# Patient Record
Sex: Female | Born: 1940 | Race: White | Hispanic: No | Smoking: Former smoker
Health system: Southern US, Community
[De-identification: ages and names within clinical notes are randomized; demographics above are authoritative.]

## PROBLEM LIST (undated history)

## (undated) DIAGNOSIS — D649 Anemia, unspecified: Secondary | ICD-10-CM

## (undated) DIAGNOSIS — J309 Allergic rhinitis, unspecified: Secondary | ICD-10-CM

## (undated) DIAGNOSIS — M810 Age-related osteoporosis without current pathological fracture: Secondary | ICD-10-CM

## (undated) DIAGNOSIS — E739 Lactose intolerance, unspecified: Secondary | ICD-10-CM

## (undated) DIAGNOSIS — R7309 Other abnormal glucose: Secondary | ICD-10-CM

## (undated) DIAGNOSIS — J45901 Unspecified asthma with (acute) exacerbation: Secondary | ICD-10-CM

## (undated) DIAGNOSIS — E039 Hypothyroidism, unspecified: Secondary | ICD-10-CM

## (undated) DIAGNOSIS — E785 Hyperlipidemia, unspecified: Secondary | ICD-10-CM

## (undated) DIAGNOSIS — I10 Essential (primary) hypertension: Secondary | ICD-10-CM

## (undated) DIAGNOSIS — R413 Other amnesia: Secondary | ICD-10-CM

## (undated) HISTORY — DX: Essential (primary) hypertension: I10

## (undated) HISTORY — DX: Allergic rhinitis, unspecified: J30.9

## (undated) HISTORY — DX: Lactose intolerance, unspecified: E73.9

## (undated) HISTORY — DX: Other amnesia: R41.3

## (undated) HISTORY — DX: Age-related osteoporosis without current pathological fracture: M81.0

## (undated) HISTORY — DX: Hypothyroidism, unspecified: E03.9

## (undated) HISTORY — DX: Unspecified asthma with (acute) exacerbation: J45.901

## (undated) HISTORY — DX: Anemia, unspecified: D64.9

## (undated) HISTORY — DX: Hyperlipidemia, unspecified: E78.5

## (undated) HISTORY — DX: Other abnormal glucose: R73.09

---

## 1974-02-04 HISTORY — PX: BREAST SURGERY: SHX581

## 1993-02-04 HISTORY — PX: EYE SURGERY: SHX253

## 1997-12-23 ENCOUNTER — Other Ambulatory Visit: Admission: RE | Admit: 1997-12-23 | Discharge: 1997-12-23 | Payer: Self-pay | Admitting: *Deleted

## 1998-03-10 ENCOUNTER — Other Ambulatory Visit: Admission: RE | Admit: 1998-03-10 | Discharge: 1998-03-10 | Payer: Self-pay | Admitting: *Deleted

## 1998-09-15 ENCOUNTER — Other Ambulatory Visit: Admission: RE | Admit: 1998-09-15 | Discharge: 1998-09-15 | Payer: Self-pay | Admitting: *Deleted

## 1998-09-15 ENCOUNTER — Encounter (INDEPENDENT_AMBULATORY_CARE_PROVIDER_SITE_OTHER): Payer: Self-pay

## 1998-11-13 ENCOUNTER — Encounter (INDEPENDENT_AMBULATORY_CARE_PROVIDER_SITE_OTHER): Payer: Self-pay | Admitting: *Deleted

## 1998-11-13 ENCOUNTER — Other Ambulatory Visit: Admission: RE | Admit: 1998-11-13 | Discharge: 1998-11-13 | Payer: Self-pay | Admitting: *Deleted

## 1999-08-02 ENCOUNTER — Encounter: Admission: RE | Admit: 1999-08-02 | Discharge: 1999-08-02 | Payer: Self-pay | Admitting: Internal Medicine

## 1999-08-02 ENCOUNTER — Encounter: Payer: Self-pay | Admitting: Internal Medicine

## 2000-08-04 ENCOUNTER — Encounter: Admission: RE | Admit: 2000-08-04 | Discharge: 2000-08-04 | Payer: Self-pay | Admitting: Internal Medicine

## 2000-08-04 ENCOUNTER — Encounter: Payer: Self-pay | Admitting: Internal Medicine

## 2001-08-05 ENCOUNTER — Encounter: Admission: RE | Admit: 2001-08-05 | Discharge: 2001-08-05 | Payer: Self-pay | Admitting: Internal Medicine

## 2001-08-05 ENCOUNTER — Encounter: Payer: Self-pay | Admitting: Internal Medicine

## 2002-04-05 ENCOUNTER — Other Ambulatory Visit: Admission: RE | Admit: 2002-04-05 | Discharge: 2002-04-05 | Payer: Self-pay | Admitting: *Deleted

## 2002-08-10 ENCOUNTER — Encounter: Admission: RE | Admit: 2002-08-10 | Discharge: 2002-08-10 | Payer: Self-pay | Admitting: Internal Medicine

## 2002-08-10 ENCOUNTER — Encounter: Payer: Self-pay | Admitting: Internal Medicine

## 2003-04-28 ENCOUNTER — Other Ambulatory Visit: Admission: RE | Admit: 2003-04-28 | Discharge: 2003-04-28 | Payer: Self-pay | Admitting: *Deleted

## 2003-08-11 ENCOUNTER — Encounter: Admission: RE | Admit: 2003-08-11 | Discharge: 2003-08-11 | Payer: Self-pay | Admitting: *Deleted

## 2003-09-22 ENCOUNTER — Encounter: Payer: Self-pay | Admitting: Endocrinology

## 2004-01-24 ENCOUNTER — Ambulatory Visit: Payer: Self-pay | Admitting: Endocrinology

## 2004-02-05 LAB — HM DEXA SCAN

## 2004-02-08 ENCOUNTER — Ambulatory Visit: Payer: Self-pay | Admitting: Endocrinology

## 2004-02-24 ENCOUNTER — Ambulatory Visit: Payer: Self-pay | Admitting: Endocrinology

## 2004-02-27 ENCOUNTER — Encounter: Admission: RE | Admit: 2004-02-27 | Discharge: 2004-05-27 | Payer: Self-pay | Admitting: Endocrinology

## 2004-08-14 ENCOUNTER — Encounter: Admission: RE | Admit: 2004-08-14 | Discharge: 2004-08-14 | Payer: Self-pay | Admitting: *Deleted

## 2004-09-20 ENCOUNTER — Ambulatory Visit: Payer: Self-pay | Admitting: Endocrinology

## 2004-11-01 ENCOUNTER — Ambulatory Visit: Payer: Self-pay | Admitting: Endocrinology

## 2004-11-08 ENCOUNTER — Ambulatory Visit: Payer: Self-pay | Admitting: Endocrinology

## 2004-11-09 ENCOUNTER — Ambulatory Visit: Payer: Self-pay | Admitting: Endocrinology

## 2004-12-14 ENCOUNTER — Ambulatory Visit: Payer: Self-pay | Admitting: Endocrinology

## 2005-01-01 ENCOUNTER — Ambulatory Visit: Payer: Self-pay | Admitting: Endocrinology

## 2005-02-05 ENCOUNTER — Ambulatory Visit: Payer: Self-pay | Admitting: Endocrinology

## 2005-02-27 ENCOUNTER — Ambulatory Visit: Payer: Self-pay | Admitting: Endocrinology

## 2005-09-24 ENCOUNTER — Encounter: Admission: RE | Admit: 2005-09-24 | Discharge: 2005-09-24 | Payer: Self-pay | Admitting: *Deleted

## 2005-10-01 ENCOUNTER — Encounter: Admission: RE | Admit: 2005-10-01 | Discharge: 2005-10-01 | Payer: Self-pay | Admitting: *Deleted

## 2005-11-06 ENCOUNTER — Ambulatory Visit: Payer: Self-pay | Admitting: Endocrinology

## 2005-11-13 ENCOUNTER — Ambulatory Visit: Payer: Self-pay | Admitting: Endocrinology

## 2005-11-13 HISTORY — PX: ELECTROCARDIOGRAM: SHX264

## 2005-11-25 ENCOUNTER — Ambulatory Visit: Payer: Self-pay | Admitting: Endocrinology

## 2005-12-31 ENCOUNTER — Ambulatory Visit: Payer: Self-pay | Admitting: Endocrinology

## 2005-12-31 LAB — CONVERTED CEMR LAB
AST: 29 units/L (ref 0–37)
Bilirubin, Direct: 0.1 mg/dL (ref 0.0–0.3)
Chol/HDL Ratio, serum: 4.1
Cholesterol: 232 mg/dL (ref 0–200)
LDL DIRECT: 158.2 mg/dL
Total Protein: 7.3 g/dL (ref 6.0–8.3)
Triglyceride fasting, serum: 117 mg/dL (ref 0–149)

## 2006-09-08 ENCOUNTER — Encounter: Payer: Self-pay | Admitting: Endocrinology

## 2006-09-08 DIAGNOSIS — M810 Age-related osteoporosis without current pathological fracture: Secondary | ICD-10-CM

## 2006-09-08 DIAGNOSIS — E039 Hypothyroidism, unspecified: Secondary | ICD-10-CM

## 2006-09-08 DIAGNOSIS — J309 Allergic rhinitis, unspecified: Secondary | ICD-10-CM | POA: Insufficient documentation

## 2006-09-08 DIAGNOSIS — I10 Essential (primary) hypertension: Secondary | ICD-10-CM | POA: Insufficient documentation

## 2006-09-08 HISTORY — DX: Essential (primary) hypertension: I10

## 2006-09-08 HISTORY — DX: Allergic rhinitis, unspecified: J30.9

## 2006-09-08 HISTORY — DX: Age-related osteoporosis without current pathological fracture: M81.0

## 2006-09-08 HISTORY — DX: Hypothyroidism, unspecified: E03.9

## 2006-09-30 ENCOUNTER — Encounter: Admission: RE | Admit: 2006-09-30 | Discharge: 2006-09-30 | Payer: Self-pay | Admitting: Endocrinology

## 2006-12-05 ENCOUNTER — Ambulatory Visit: Payer: Self-pay | Admitting: Endocrinology

## 2006-12-05 LAB — CONVERTED CEMR LAB
ALT: 31 units/L (ref 0–35)
AST: 26 units/L (ref 0–37)
Albumin: 4 g/dL (ref 3.5–5.2)
Alkaline Phosphatase: 77 units/L (ref 39–117)
Basophils Absolute: 0 10*3/uL (ref 0.0–0.1)
Bilirubin, Direct: 0.1 mg/dL (ref 0.0–0.3)
CO2: 31 meq/L (ref 19–32)
Cholesterol: 181 mg/dL (ref 0–200)
Eosinophils Relative: 2.4 % (ref 0.0–5.0)
GFR calc Af Amer: 107 mL/min
HDL: 43.5 mg/dL (ref >39.0)
Hemoglobin, Urine: NEGATIVE
LDL Cholesterol: 110 mg/dL — ABNORMAL HIGH (ref 0–99)
Leukocytes, UA: NEGATIVE
Lymphocytes Relative: 34.3 % (ref 12.0–46.0)
MCV: 94.2 fL (ref 78.0–100.0)
Monocytes Relative: 7.4 % (ref 3.0–11.0)
Platelets: 219 10*3/uL (ref 150–400)
RDW: 11.7 % (ref 11.5–14.6)
Sodium: 144 meq/L (ref 135–145)
TSH: 2.03 microintl units/mL (ref 0.35–5.50)
Total Bilirubin: 0.6 mg/dL (ref 0.3–1.2)
Total Protein, Urine: NEGATIVE mg/dL
Triglycerides: 140 mg/dL (ref 0–149)
Urine Glucose: NEGATIVE mg/dL
Urobilinogen, UA: 0.2 (ref 0.0–1.0)
WBC: 4.9 10*3/uL (ref 4.5–10.5)

## 2006-12-15 ENCOUNTER — Telehealth (INDEPENDENT_AMBULATORY_CARE_PROVIDER_SITE_OTHER): Payer: Self-pay | Admitting: *Deleted

## 2006-12-16 ENCOUNTER — Telehealth (INDEPENDENT_AMBULATORY_CARE_PROVIDER_SITE_OTHER): Payer: Self-pay | Admitting: *Deleted

## 2006-12-19 ENCOUNTER — Telehealth (INDEPENDENT_AMBULATORY_CARE_PROVIDER_SITE_OTHER): Payer: Self-pay | Admitting: *Deleted

## 2006-12-30 ENCOUNTER — Ambulatory Visit: Payer: Self-pay | Admitting: Endocrinology

## 2007-02-10 ENCOUNTER — Ambulatory Visit: Payer: Self-pay | Admitting: Endocrinology

## 2007-04-08 ENCOUNTER — Telehealth (INDEPENDENT_AMBULATORY_CARE_PROVIDER_SITE_OTHER): Payer: Self-pay | Admitting: *Deleted

## 2007-07-28 ENCOUNTER — Telehealth (INDEPENDENT_AMBULATORY_CARE_PROVIDER_SITE_OTHER): Payer: Self-pay | Admitting: *Deleted

## 2007-10-01 ENCOUNTER — Encounter: Admission: RE | Admit: 2007-10-01 | Discharge: 2007-10-01 | Payer: Self-pay | Admitting: Endocrinology

## 2007-10-07 ENCOUNTER — Encounter: Admission: RE | Admit: 2007-10-07 | Discharge: 2007-10-07 | Payer: Self-pay | Admitting: Endocrinology

## 2007-10-07 ENCOUNTER — Encounter: Payer: Self-pay | Admitting: Endocrinology

## 2007-11-12 ENCOUNTER — Telehealth (INDEPENDENT_AMBULATORY_CARE_PROVIDER_SITE_OTHER): Payer: Self-pay | Admitting: *Deleted

## 2007-11-20 ENCOUNTER — Telehealth (INDEPENDENT_AMBULATORY_CARE_PROVIDER_SITE_OTHER): Payer: Self-pay | Admitting: *Deleted

## 2007-11-20 ENCOUNTER — Ambulatory Visit: Payer: Self-pay | Admitting: Endocrinology

## 2008-01-25 ENCOUNTER — Telehealth (INDEPENDENT_AMBULATORY_CARE_PROVIDER_SITE_OTHER): Payer: Self-pay | Admitting: *Deleted

## 2008-02-03 ENCOUNTER — Ambulatory Visit: Payer: Self-pay | Admitting: Endocrinology

## 2008-02-04 LAB — CONVERTED CEMR LAB
ALT: 22 units/L (ref 0–35)
AST: 22 units/L (ref 0–37)
Alkaline Phosphatase: 89 units/L (ref 39–117)
BUN: 9 mg/dL (ref 6–23)
Bilirubin Urine: NEGATIVE
Cholesterol: 193 mg/dL (ref 0–200)
GFR calc Af Amer: 107 mL/min
Glucose, Bld: 111 mg/dL — ABNORMAL HIGH (ref 70–99)
HDL: 47.7 mg/dL (ref >39.0)
Ketones, ur: NEGATIVE mg/dL
Leukocytes, UA: NEGATIVE
MCHC: 34.5 g/dL (ref 30.0–36.0)
Platelets: 218 10*3/uL (ref 150–400)
Potassium: 4.3 meq/L (ref 3.5–5.1)
RBC: 3.71 M/uL — ABNORMAL LOW (ref 3.87–5.11)
Total CHOL/HDL Ratio: 4
Total Protein: 6.4 g/dL (ref 6.0–8.3)
Triglycerides: 113 mg/dL (ref 0–149)
Urine Glucose: NEGATIVE mg/dL
Urobilinogen, UA: 0.2 (ref 0.0–1.0)
WBC: 4.5 10*3/uL (ref 4.5–10.5)
pH: 6.5 (ref 5.0–8.0)

## 2008-02-07 ENCOUNTER — Encounter: Payer: Self-pay | Admitting: Endocrinology

## 2008-02-09 ENCOUNTER — Encounter: Payer: Self-pay | Admitting: Endocrinology

## 2008-02-11 ENCOUNTER — Ambulatory Visit: Payer: Self-pay | Admitting: Endocrinology

## 2008-02-11 DIAGNOSIS — R7309 Other abnormal glucose: Secondary | ICD-10-CM | POA: Insufficient documentation

## 2008-02-11 DIAGNOSIS — E7800 Pure hypercholesterolemia, unspecified: Secondary | ICD-10-CM | POA: Insufficient documentation

## 2008-02-11 HISTORY — DX: Other abnormal glucose: R73.09

## 2008-02-15 ENCOUNTER — Ambulatory Visit: Payer: Self-pay | Admitting: Endocrinology

## 2008-03-07 ENCOUNTER — Telehealth: Payer: Self-pay | Admitting: Endocrinology

## 2008-07-18 ENCOUNTER — Telehealth: Payer: Self-pay | Admitting: Endocrinology

## 2008-08-29 ENCOUNTER — Ambulatory Visit: Payer: Self-pay | Admitting: Endocrinology

## 2008-09-14 ENCOUNTER — Ambulatory Visit: Payer: Self-pay | Admitting: Endocrinology

## 2008-10-03 ENCOUNTER — Encounter: Admission: RE | Admit: 2008-10-03 | Discharge: 2008-10-03 | Payer: Self-pay | Admitting: Endocrinology

## 2008-10-06 ENCOUNTER — Encounter: Payer: Self-pay | Admitting: Endocrinology

## 2008-12-05 ENCOUNTER — Telehealth: Payer: Self-pay | Admitting: Endocrinology

## 2008-12-08 ENCOUNTER — Telehealth: Payer: Self-pay | Admitting: Endocrinology

## 2008-12-09 ENCOUNTER — Encounter: Payer: Self-pay | Admitting: Endocrinology

## 2009-01-23 ENCOUNTER — Telehealth: Payer: Self-pay | Admitting: Endocrinology

## 2009-02-07 ENCOUNTER — Ambulatory Visit: Payer: Self-pay | Admitting: Endocrinology

## 2009-02-07 DIAGNOSIS — J069 Acute upper respiratory infection, unspecified: Secondary | ICD-10-CM | POA: Insufficient documentation

## 2009-02-07 LAB — CONVERTED CEMR LAB
ALT: 19 units/L (ref 0–35)
AST: 22 units/L (ref 0–37)
Albumin: 3.9 g/dL (ref 3.5–5.2)
Alkaline Phosphatase: 94 units/L (ref 39–117)
BUN: 7 mg/dL (ref 6–23)
Basophils Relative: 0.9 % (ref 0.0–3.0)
Calcium: 9.9 mg/dL (ref 8.4–10.5)
Chloride: 101 meq/L (ref 96–112)
Creatinine, Ser: 0.7 mg/dL (ref 0.4–1.2)
Direct LDL: 192.7 mg/dL
Eosinophils Absolute: 0.1 10*3/uL (ref 0.0–0.7)
Ketones, ur: NEGATIVE mg/dL
Lymphocytes Relative: 19.1 % (ref 12.0–46.0)
MCHC: 32.9 g/dL (ref 30.0–36.0)
MCV: 98.2 fL (ref 78.0–100.0)
Monocytes Absolute: 0.3 10*3/uL (ref 0.1–1.0)
Monocytes Relative: 4.7 % (ref 3.0–12.0)
Neutro Abs: 5.5 10*3/uL (ref 1.4–7.7)
Neutrophils Relative %: 74 % (ref 43.0–77.0)
Platelets: 297 10*3/uL (ref 150.0–400.0)
Potassium: 4.5 meq/L (ref 3.5–5.1)
RBC: 3.87 M/uL (ref 3.87–5.11)
RDW: 11 % — ABNORMAL LOW (ref 11.5–14.6)
Sodium: 139 meq/L (ref 135–145)
TSH: 3.13 microintl units/mL (ref 0.35–5.50)
Total Bilirubin: 0.6 mg/dL (ref 0.3–1.2)
Total Protein: 7.6 g/dL (ref 6.0–8.3)
Urine Glucose: NEGATIVE mg/dL
pH: 7 (ref 5.0–8.0)

## 2009-02-17 ENCOUNTER — Telehealth: Payer: Self-pay | Admitting: Internal Medicine

## 2009-02-20 ENCOUNTER — Ambulatory Visit: Payer: Self-pay | Admitting: Internal Medicine

## 2009-02-20 DIAGNOSIS — R05 Cough: Secondary | ICD-10-CM

## 2009-02-20 DIAGNOSIS — R059 Cough, unspecified: Secondary | ICD-10-CM | POA: Insufficient documentation

## 2009-02-27 ENCOUNTER — Ambulatory Visit: Payer: Self-pay | Admitting: Endocrinology

## 2009-04-04 ENCOUNTER — Telehealth: Payer: Self-pay | Admitting: Endocrinology

## 2009-04-10 ENCOUNTER — Emergency Department (HOSPITAL_BASED_OUTPATIENT_CLINIC_OR_DEPARTMENT_OTHER): Admission: EM | Admit: 2009-04-10 | Discharge: 2009-04-10 | Payer: Self-pay | Admitting: Emergency Medicine

## 2009-04-12 ENCOUNTER — Telehealth: Payer: Self-pay | Admitting: Endocrinology

## 2009-04-20 ENCOUNTER — Ambulatory Visit: Payer: Self-pay | Admitting: Endocrinology

## 2009-04-20 DIAGNOSIS — S81809A Unspecified open wound, unspecified lower leg, initial encounter: Secondary | ICD-10-CM | POA: Insufficient documentation

## 2009-04-20 DIAGNOSIS — S91009A Unspecified open wound, unspecified ankle, initial encounter: Secondary | ICD-10-CM

## 2009-04-20 DIAGNOSIS — S81009A Unspecified open wound, unspecified knee, initial encounter: Secondary | ICD-10-CM | POA: Insufficient documentation

## 2009-09-05 ENCOUNTER — Emergency Department (HOSPITAL_COMMUNITY): Admission: AC | Admit: 2009-09-05 | Discharge: 2009-09-05 | Payer: Self-pay

## 2009-09-07 ENCOUNTER — Ambulatory Visit: Payer: Self-pay | Admitting: Endocrinology

## 2009-09-08 ENCOUNTER — Emergency Department (HOSPITAL_COMMUNITY): Admission: EM | Admit: 2009-09-08 | Discharge: 2009-09-08 | Payer: Self-pay | Admitting: Family Medicine

## 2009-09-15 ENCOUNTER — Emergency Department (HOSPITAL_COMMUNITY): Admission: EM | Admit: 2009-09-15 | Discharge: 2009-09-15 | Payer: Self-pay | Admitting: Family Medicine

## 2009-09-22 ENCOUNTER — Emergency Department (HOSPITAL_COMMUNITY): Admission: EM | Admit: 2009-09-22 | Discharge: 2009-09-22 | Payer: Self-pay | Admitting: Emergency Medicine

## 2009-11-02 ENCOUNTER — Encounter: Admission: RE | Admit: 2009-11-02 | Discharge: 2009-11-02 | Payer: Self-pay | Admitting: Endocrinology

## 2009-11-29 ENCOUNTER — Encounter: Payer: Self-pay | Admitting: Endocrinology

## 2010-02-26 ENCOUNTER — Ambulatory Visit
Admission: RE | Admit: 2010-02-26 | Discharge: 2010-02-26 | Payer: Self-pay | Source: Home / Self Care | Attending: Endocrinology | Admitting: Endocrinology

## 2010-02-26 ENCOUNTER — Encounter: Payer: Self-pay | Admitting: Endocrinology

## 2010-02-26 ENCOUNTER — Other Ambulatory Visit: Payer: Self-pay | Admitting: Endocrinology

## 2010-02-26 LAB — CBC WITH DIFFERENTIAL/PLATELET
Basophils Absolute: 0.1 10*3/uL (ref 0.0–0.1)
Basophils Relative: 1.2 % (ref 0.0–3.0)
Hemoglobin: 11.7 g/dL — ABNORMAL LOW (ref 12.0–15.0)
Lymphocytes Relative: 41.6 % (ref 12.0–46.0)
Lymphs Abs: 1.8 10*3/uL (ref 0.7–4.0)
Monocytes Absolute: 0.3 10*3/uL (ref 0.1–1.0)
Neutrophils Relative %: 47.7 % (ref 43.0–77.0)
Platelets: 219 10*3/uL (ref 150.0–400.0)
RDW: 12 % (ref 11.5–14.6)

## 2010-02-26 LAB — BASIC METABOLIC PANEL
BUN: 16 mg/dL (ref 6–23)
CO2: 26 mEq/L (ref 19–32)
Calcium: 9 mg/dL (ref 8.4–10.5)

## 2010-02-26 LAB — HEPATIC FUNCTION PANEL
ALT: 26 U/L (ref 0–35)
Alkaline Phosphatase: 67 U/L (ref 39–117)
Bilirubin, Direct: 0.1 mg/dL (ref 0.0–0.3)
Total Bilirubin: 0.7 mg/dL (ref 0.3–1.2)

## 2010-02-26 LAB — URINALYSIS
Hemoglobin, Urine: NEGATIVE
Ketones, ur: NEGATIVE
Nitrite: NEGATIVE
Specific Gravity, Urine: 1.015 (ref 1.000–1.030)
Urine Glucose: NEGATIVE

## 2010-02-26 LAB — TSH: TSH: 0.66 u[IU]/mL (ref 0.35–5.50)

## 2010-02-26 LAB — LIPID PANEL: Total CHOL/HDL Ratio: 3

## 2010-03-05 ENCOUNTER — Other Ambulatory Visit: Payer: Self-pay | Admitting: Endocrinology

## 2010-03-05 ENCOUNTER — Ambulatory Visit
Admission: RE | Admit: 2010-03-05 | Discharge: 2010-03-05 | Payer: Self-pay | Source: Home / Self Care | Attending: Endocrinology | Admitting: Endocrinology

## 2010-03-05 ENCOUNTER — Telehealth: Payer: Self-pay | Admitting: Endocrinology

## 2010-03-05 ENCOUNTER — Encounter: Payer: Self-pay | Admitting: Endocrinology

## 2010-03-05 DIAGNOSIS — D649 Anemia, unspecified: Secondary | ICD-10-CM

## 2010-03-05 HISTORY — DX: Anemia, unspecified: D64.9

## 2010-03-05 LAB — CBC WITH DIFFERENTIAL/PLATELET
Basophils Absolute: 0 10*3/uL (ref 0.0–0.1)
Basophils Relative: 0.8 % (ref 0.0–3.0)
HCT: 34.7 % — ABNORMAL LOW (ref 36.0–46.0)
Lymphs Abs: 1.6 10*3/uL (ref 0.7–4.0)
MCV: 94.6 fl (ref 78.0–100.0)
Monocytes Relative: 6.7 % (ref 3.0–12.0)
Platelets: 232 10*3/uL (ref 150.0–400.0)

## 2010-03-05 LAB — HEMOGLOBIN A1C: Hgb A1c MFr Bld: 6 % (ref 4.6–6.5)

## 2010-03-05 LAB — IBC PANEL
Iron: 80 ug/dL (ref 42–145)
Saturation Ratios: 20.4 % (ref 20.0–50.0)
Transferrin: 279.9 mg/dL (ref 212.0–360.0)

## 2010-03-06 ENCOUNTER — Ambulatory Visit
Admission: RE | Admit: 2010-03-06 | Discharge: 2010-03-06 | Payer: Self-pay | Source: Home / Self Care | Attending: Endocrinology | Admitting: Endocrinology

## 2010-03-06 ENCOUNTER — Encounter: Payer: Self-pay | Admitting: Endocrinology

## 2010-03-08 NOTE — Assessment & Plan Note (Signed)
Summary: CPX/$50/BCBS/CD-RS BUMP/PHONE  STC   Vital Signs:  Patient profile:   70 year old female Height:      61 inches (154.94 cm) Weight:      163 pounds (74.09 kg) O2 Sat:      97 % on Room air Temp:     97.2 degrees F (36.22 degrees C) oral Pulse rate:   97 / minute BP sitting:   140 / 82  (left arm) Cuff size:   regular  Vitals Entered By: Josph Macho CMA (February 27, 2009 8:34 AM)  O2 Flow:  Room air CC: Physical/ CF   Primary Provider:  Minus Breeding MD  CC:  Physical/ CF.  History of Present Illness: here for regular wellness examination.  she's feeling pretty well in general (except for nasal congestion), and does not smoke.  alcohol is rare.   Current Medications (verified): 1)  Senokot S 8.6-50 Mg  Tabs (Sennosides-Docusate Sodium) .... Take 3 Qd 2)  Calcium 500/d 500-200 Mg-Unit  Tabs (Calcium Carbonate-Vitamin D) .... Take 1 By Mouth Bid 3)  Actos 15 Mg  Tabs (Pioglitazone Hcl) .... Take 1 By Mouth Qd 4)  Flonase 50 Mcg/act  Susp (Fluticasone Propionate) .... Use 1 Spray Each Nostril Qd 5)  Singulair 10 Mg  Tabs (Montelukast Sodium) .... Take 1 By Mouth Qd 6)  Synthroid 150 Mcg  Tabs (Levothyroxine Sodium) .... Qd 7)  Lipitor 20 Mg  Tabs (Atorvastatin Calcium) .... Qhs 8)  Benicar 40 Mg Tabs (Olmesartan Medoxomil) .Marland Kitchen.. 1 Qd 9)  Tessalon 200 Mg Caps (Benzonatate) .Marland Kitchen.. 1 By Mouth Three Times A Day As Needed For Cough (Or As Directed) 10)  Proair Hfa 108 (90 Base) Mcg/act Aers (Albuterol Sulfate) .... 2 Puffs Every 4 Hours As Needed For Cough and Shortness of Breath  Allergies (verified): 1)  ! Penicillin 2)  ! Sulfa 3)  ! * Asprin 4)  ! Fosamax 5)  ! Vasotec  Past History:  Past Medical History: Last updated: 09/08/2006 Allergic rhinitis Hypertension Hypothyroidism Osteoporosis Lactose intollerate Dyslipidemia Hyperglycemia/DM  Family History: Reviewed history from 02/10/2007 and no changes required. no cancer  Social History: Reviewed  history from 02/10/2007 and no changes required. widowed works as Water engineer  Review of Systems  The patient denies fever, weight loss, weight gain, vision loss, decreased hearing, chest pain, syncope, dyspnea on exertion, prolonged cough, headaches, abdominal pain, melena, hematochezia, severe indigestion/heartburn, hematuria, suspicious skin lesions, and depression.    Physical Exam  General:  normal appearance.   Head:  head: no deformity eyes: no periorbital swelling, no proptosis external nose and ears are normal mouth: no lesion seen Ears:  TM's intact and clear with normal canals with grossly normal hearing.   Breasts:  No tenderness, masses, nipple discharge, or skin abnormalities.  Abdomen:  abdomen is soft, nontender.  no hepatosplenomegaly.   not distended.  no hernia  Rectal:  normal external and internal exam.  heme neg  Genitalia:  Pelvic Exam:        External: normal female genitalia without lesions or masses        Vagina: normal without lesions or masses        Cervix: normal without lesions or masses        Adnexa: normal bimanual exam without masses or fullness        Uterus: normal by palpation        Pap smear: not performed Msk:  muscle bulk and strength are grossly normal.  no obvious joint swelling.  gait is normal and steady  Pulses:  dorsalis pedis intact bilat.  no carotid bruit  Extremities:  no deformity.  no ulcer on the feet.  feet are of normal color and temp.  no edema  Neurologic:  cn 2-12 grossly intact.   readily moves all 4's.   sensation is intact to touch on the feet  Skin:  normal texture and temp.  no rash.  not diaphoretic  Cervical Nodes:  No significant adenopathy.  Psych:  Alert and cooperative; normal mood and affect; normal attention span and concentration.     Impression & Recommendations:  Problem # 1:  ROUTINE GENERAL MEDICAL EXAM@HEALTH  CARE FACL (ICD-V70.0)  Medications Added to Medication List This Visit: 1)   Lipitor 20 Mg Tabs (Atorvastatin calcium) .Marland Kitchen.. 1 at bedtime 2)  Clarinex 5 Mg Tabs (Desloratadine) .Marland Kitchen.. 1 qd  Other Orders: EKG w/ Interpretation (93000) Est. Patient 65& > (81191)  Preventive Care Screening     pt says she is up to date with colonoscopy, dr Matthias Hughs   Patient Instructions: 1)  same medications 2)  return here 1 year 3)  i am happy to refer you to dr young (allergist).  call us if you want to go. Prescriptions: CLARINEX 5 MG TABS (DESLORATADINE) 1 qd  #90 x 3   Entered and Authorized by:   Minus Breeding MD   Signed by:   Minus Breeding MD on 02/27/2009   Method used:   Print then Give to Patient   RxID:   [id number] LIPITOR 20 MG TABS (ATORVASTATIN CALCIUM) 1 at bedtime  #90 x 3   Entered and Authorized by:   Minus Breeding MD   Signed by:   Minus Breeding MD on 02/27/2009   Method used:   Print then Give to Patient   RxID:   [id number] BENICAR 40 MG TABS (OLMESARTAN MEDOXOMIL) 1 qd  #90 x 3   Entered and Authorized by:   Minus Breeding MD   Signed by:   Minus Breeding MD on 02/27/2009   Method used:   Print then Give to Patient   RxID:   [id number] SYNTHROID 150 MCG  TABS (LEVOTHYROXINE SODIUM) qd  #90 x 3   Entered and Authorized by:   Minus Breeding MD   Signed by:   Minus Breeding MD on 02/27/2009   Method used:   Print then Give to Patient   RxID:   [id number] SINGULAIR 10 MG  TABS (MONTELUKAST SODIUM) take 1 by mouth qd  #90 x 3   Entered and Authorized by:   Minus Breeding MD   Signed by:   Minus Breeding MD on 02/27/2009   Method used:   Print then Give to Patient   RxID:   [id number] FLONASE 50 MCG/ACT  SUSP (FLUTICASONE PROPIONATE) use 1 spray each nostril qd  #3 x 3   Entered and Authorized by:   Minus Breeding MD   Signed by:   Minus Breeding MD on 02/27/2009   Method used:   Print then Give to Patient   RxID:   [id number] ACTOS 15 MG  TABS (PIOGLITAZONE HCL) take 1 by mouth qd  #90 x 3   Entered and  Authorized by:   Minus Breeding MD   Signed by:   Minus Breeding MD on 02/27/2009   Method used:   Print then Give to Patient   RxID:  [id number]    Preventive Care Screening     pt says she is up to date with colonoscopy, dr Matthias Hughs

## 2010-03-08 NOTE — Progress Notes (Signed)
Summary: FYI  Phone Note Call from Patient   Caller: Patient [phone] Summary of Call: pt called to inform MD that she fell on iced steps Monday morning and went to UC.  Initial call taken by: Margaret Pyle, CMA,  April 12, 2009 8:42 AM  Follow-up for Phone Call        noted, thank you Follow-up by: Minus Breeding MD,  April 12, 2009 9:04 AM

## 2010-03-08 NOTE — Progress Notes (Signed)
Summary: rx request  Phone Note Call from Patient Call back at Home Phone [phone]   Caller: Patient [phone] Summary of Call: pt called requesting refills of Pro-Air to [facility] that was RX'd by [facility] 02/20/2009 Initial call taken by: Margaret Pyle, CMA,  April 04, 2009 2:20 PM  Follow-up for Phone Call        i am happy to refill it.  how often do you need it.  if it is often, we'll need to add another inhaler. Follow-up by: Minus Breeding MD,  April 04, 2009 3:00 PM  Additional Follow-up for Phone Call Additional follow up Details #1::        left message on machine for pt to return my call  Additional Follow-up by: Margaret Pyle, CMA,  April 04, 2009 3:37 PM    Additional Follow-up for Phone Call Additional follow up Details #2::    pt states that she uses inhaler two times a day. rx faxed to [facility] Follow-up by: Margaret Pyle, CMA,  April 05, 2009 1:28 PM  New/Updated Medications: PROAIR HFA 108 (90 BASE) MCG/ACT AERS (ALBUTEROL SULFATE) 2 puffs two times a day Prescriptions: PROAIR HFA 108 (90 BASE) MCG/ACT AERS (ALBUTEROL SULFATE) 2 puffs two times a day  #3 x 3   Entered by:   Margaret Pyle, CMA   Authorized by:   Minus Breeding MD   Signed by:   Margaret Pyle, CMA on 04/05/2009   Method used:   Faxed to ...       [facility]* (mail-order)             ,          Ph: [phone]       Fax: [phone]   RxID:   [id number]

## 2010-03-08 NOTE — Letter (Signed)
Summary: Out of Work  [facility]  [address] [place], [place] [address]   Phone: [phone]  Fax: [phone]    April 20, 2009   Employee:  Daniyla L Feijoo    To Whom It May Concern:   For Medical reasons, please excuse the above named employee from work for the following dates:  Start:   04/20/09  End:   04/24/09   Sincerely,    Minus Breeding MD

## 2010-03-08 NOTE — Procedures (Signed)
Summary: Flexible Sigmoidoscopy/[facility]  Flexible Sigmoidoscopy/[facility]   Imported By: Sherian Rein 12/01/2009 10:24:18  _____________________________________________________________________  External Attachment:    Type:   Image     Comment:   External Document

## 2010-03-08 NOTE — Progress Notes (Signed)
Summary: Med change  Phone Note Call from Patient   Summary of Call: Pt called stating that she received Dr George Hugh message about cholesterol. However, she can't tolerate Lipitor 40, makes her sleepy and feel "stupid". She wants a prescription for Lipitor 20. Pt states she can tolerate this dose, she stated that she has even cut the 40's in half but its not the same as Lipitor 20? Please advise? Call her at home or at work at above numbers. Initial call taken by: Josph Macho CMA,  February 17, 2009 8:35 AM  Follow-up for Phone Call        ok to send rx for Lipitor 20mg  tabs - thanks Follow-up by: Newt Lukes MD,  February 17, 2009 9:50 AM    New/Updated Medications: * LIPITOR 20 MG  TABS (ATORVASTATIN CALCIUM) qhs Prescriptions: LIPITOR 20 MG  TABS (ATORVASTATIN CALCIUM) qhs  #30 x 2   Entered by:   Josph Macho CMA   Authorized by:   Newt Lukes MD   Signed by:   Josph Macho CMA on 02/17/2009   Method used:   Faxed to ...       [facility]  [phone]* (retail)       [address] [place], [place]  [address]       Ph: [phone] or [phone]       Fax: [phone]   RxID:   [id number]

## 2010-03-08 NOTE — Assessment & Plan Note (Signed)
Summary: ER FU PER TRIAGE/NWS  #   Vital Signs:  Patient profile:   70 year old female Height:      61 inches (154.94 cm) Weight:      161 pounds (73.18 kg) BMI:     30.53 O2 Sat:      98 % on Room air Temp:     97.0 degrees F (36.11 degrees C) oral Pulse rate:   81 / minute BP sitting:   130 / 82  (left arm) Cuff size:   regular  Vitals Entered By: Brenton Grills MA (September 07, 2009 11:29 AM)  O2 Flow:  Room air CC: ER F/U/dog bites/aj   Primary Provider:  Minus Breeding MD  CC:  ER F/U/dog bites/aj.  History of Present Illness: 2 days ago, pt was attached by 3 pit bulls.  she was seen at [facility], where she was noted to have bite wounds at the face, left upper arm, left forearm, right index finger, left hand, and back.    Current Medications (verified): 1)  Senokot S 8.6-50 Mg  Tabs (Sennosides-Docusate Sodium) .... Take 3 Qd 2)  Calcium 500/d 500-200 Mg-Unit  Tabs (Calcium Carbonate-Vitamin D) .... Take 1 By Mouth Bid 3)  Actos 15 Mg  Tabs (Pioglitazone Hcl) .... Take 1 By Mouth Qd 4)  Flonase 50 Mcg/act  Susp (Fluticasone Propionate) .... Use 1 Spray Each Nostril Qd 5)  Singulair 10 Mg  Tabs (Montelukast Sodium) .... Take 1 By Mouth Qd 6)  Synthroid 150 Mcg  Tabs (Levothyroxine Sodium) .... Qd 7)  Benicar 40 Mg Tabs (Olmesartan Medoxomil) .Marland Kitchen.. 1 Qd 8)  Lipitor 20 Mg Tabs (Atorvastatin Calcium) .Marland Kitchen.. 1 At Bedtime 9)  Clarinex 5 Mg Tabs (Desloratadine) .Marland Kitchen.. 1 Qd 10)  Proair Hfa 108 (90 Base) Mcg/act Aers (Albuterol Sulfate) .... 2 Puffs Two Times A Day 11)  Hydrocodone-Acetaminophen 5-325 Mg Tabs (Hydrocodone-Acetaminophen) .... Take 1-2 Tablets By Mouth Every 4-6 Hrs As Needed For Pain 12)  Clindamycin Hcl 300 Mg Caps (Clindamycin Hcl) .Marland Kitchen.. 1 Capsule By Mouth Two Times A Day For 7 Days  Allergies (verified): 1)  ! Penicillin 2)  ! Sulfa 3)  ! * Asprin 4)  ! Fosamax 5)  ! Vasotec  Past History:  Past Medical History: Last updated: 09/08/2006 Allergic  rhinitis Hypertension Hypothyroidism Osteoporosis Lactose intollerate Dyslipidemia Hyperglycemia/DM  Review of Systems  The patient denies fever.    Physical Exam  General:  normal appearance.   Skin:  bite wounds at areas noted above are closed.  no erythema/drainage/swelling   Impression & Recommendations:  Problem # 1:  DOG BITES (ICD-E906.0) Assessment Improved improved  Medications Added to Medication List This Visit: 1)  Hydrocodone-acetaminophen 5-325 Mg Tabs (Hydrocodone-acetaminophen) .... Take 1-2 tablets by mouth every 4-6 hrs as needed for pain 2)  Clindamycin Hcl 300 Mg Caps (Clindamycin hcl) .Marland Kitchen.. 1 capsule by mouth two times a day for 7 days  Other Orders: Surgical Referral (Surgery) Est. Patient Level III ([address])  Patient Instructions: 1)  refer plastic surgery.  you will be called with a day and time for an appointment 2)  complete rabies vaccine series as scheduled. 3)  finish clindamycin. 4)  continue vicodin as needed for pain.   5)  (dressings are re-applied).

## 2010-03-08 NOTE — Assessment & Plan Note (Signed)
Summary: ?sinus inf/cd   Vital Signs:  Patient profile:   70 year old female Height:      61 inches (154.94 cm) Weight:      163.13 pounds (74.15 kg) O2 Sat:      98 % on Room air Temp:     97.6 degrees F (36.44 degrees C) oral Pulse rate:   84 / minute BP sitting:   128 / 86  (left arm) Cuff size:   large  Vitals Entered By: Josph Macho CMA (February 07, 2009 10:29 AM)  O2 Flow:  Room air CC: Nasal Congestion-possible sinus infection? X6days/ CF Is Patient Diabetic? No   CC:  Nasal Congestion-possible sinus infection? X6days/ CF.  History of Present Illness: 1 week of nasal congestion, and "post-nasal drip,"  but little or no cough.  no earache. therapy of dyslipidemia has been limited by multiple perceived drug intolerances   Current Medications (verified): 1)  Senokot S 8.6-50 Mg  Tabs (Sennosides-Docusate Sodium) .... Take 3 Qd 2)  Calcium 500/d 500-200 Mg-Unit  Tabs (Calcium Carbonate-Vitamin D) .... Take 1 By Mouth Bid 3)  Actos 15 Mg  Tabs (Pioglitazone Hcl) .... Take 1 By Mouth Qd 4)  Flonase 50 Mcg/act  Susp (Fluticasone Propionate) .... Use 1 Spray Each Nostril Qd 5)  Singulair 10 Mg  Tabs (Montelukast Sodium) .... Take 1 By Mouth Qd 6)  Synthroid 150 Mcg  Tabs (Levothyroxine Sodium) .... Qd 7)  Lipitor 40 Mg  Tabs (Atorvastatin Calcium) .... Qhs 8)  Benicar 40 Mg Tabs (Olmesartan Medoxomil) .Marland Kitchen.. 1 Qd  Allergies (verified): 1)  ! Penicillin 2)  ! Sulfa 3)  ! * Asprin 4)  ! Fosamax 5)  ! Vasotec  Past History:  Past Medical History: Last updated: 09/08/2006 Allergic rhinitis Hypertension Hypothyroidism Osteoporosis Lactose intollerate Dyslipidemia Hyperglycemia/DM  Review of Systems  The patient denies fever.    Physical Exam  General:  normal appearance.   Head:  head: no deformity eyes: no periorbital swelling, no proptosis external nose and ears are normal mouth: no lesion seen Ears:  both tm's are red Lungs:  Clear to auscultation  bilaterally. Normal respiratory effort.  Additional Exam:  Cholesterol          [H]  276 mg/dL                   1-610 Triglycerides        [H]  252.0 mg/dL                 9.6-045.4 HDL                       09.81 mg/dL     Impression & Recommendations:  Problem # 1:  URI (ICD-465.9) Assessment New  Problem # 2:  HYPERCHOLESTEROLEMIA (ICD-272.0) needs increased rx  Medications Added to Medication List This Visit: 1)  Doxycycline Hyclate 100 Mg Caps (Doxycycline hyclate) .Marland Kitchen.. 1 bid  Other Orders: TLB-Lipid Panel (80061-LIPID) TLB-BMP (Basic Metabolic Panel-BMET) (80048-METABOL) TLB-CBC Platelet - w/Differential (85025-CBCD) TLB-Hepatic/Liver Function Pnl (80076-HEPATIC) TLB-TSH (Thyroid Stimulating Hormone) (84443-TSH) TLB-A1C / Hgb A1C (Glycohemoglobin) (83036-A1C) TLB-Udip w/ Micro (81001-URINE) Est. Patient Level III (19147) Est. Patient Level IV (82956)  Patient Instructions: 1)  doxycycline 100 mg two times a day 2)  take non-prescription loratadine-d and mucinex, as needed for congestion. 3)  physical soon 4)  same lipitor. 5)  continue to work on [employer]. Prescriptions: DOXYCYCLINE HYCLATE 100 MG CAPS (DOXYCYCLINE HYCLATE)  1 bid  #14 x 0   Entered and Authorized by:   Minus Breeding MD   Signed by:   Minus Breeding MD on 02/07/2009   Method used:   Electronically to        [facility]  [phone]* (retail)       [address] [place], [place]  [address]       Ph: [phone] or [phone]       Fax: [phone]   RxID:   [id number]   Preventive Care Screening  Last Flu Shot:    Date:  10/05/2008    Results:  historical   Mammogram:    Date:  05/05/2008    Results:  historical

## 2010-03-08 NOTE — Letter (Signed)
Summary: Skin Tests/[facility]  Skin Tests/[facility]   Imported By: Sherian Rein 03/01/2009 14:43:57  _____________________________________________________________________  External Attachment:    Type:   Image     Comment:   External Document

## 2010-03-08 NOTE — Assessment & Plan Note (Signed)
Summary: STITCH REMOVAL/ WENT TO [facility]/ FELL ON 3-7 /NWS   Vital Signs:  Patient profile:   70 year old female Height:      61 inches (154.94 cm) Weight:      166.13 pounds (75.51 kg) O2 Sat:      97 % on Room air Temp:     97.9 degrees F (36.61 degrees C) oral Pulse rate:   96 / minute BP sitting:   142 / 82  (left arm) Cuff size:   regular  Vitals Entered By: Josph Macho RMA (April 20, 2009 8:31 AM)  O2 Flow:  Room air CC: Stitch removal on left leg- fell on 04-10-09/ CF Is Patient Diabetic? No   Primary Provider:  Minus Breeding MD  CC:  Stitch removal on left leg- fell on 04-10-09/ CF.  History of Present Illness: 10 days ago, she stumbled and fell, striking her left leg on steps outside her home.  she had sutures placed in er.  Current Medications (verified): 1)  Senokot S 8.6-50 Mg  Tabs (Sennosides-Docusate Sodium) .... Take 3 Qd 2)  Calcium 500/d 500-200 Mg-Unit  Tabs (Calcium Carbonate-Vitamin D) .... Take 1 By Mouth Bid 3)  Actos 15 Mg  Tabs (Pioglitazone Hcl) .... Take 1 By Mouth Qd 4)  Flonase 50 Mcg/act  Susp (Fluticasone Propionate) .... Use 1 Spray Each Nostril Qd 5)  Singulair 10 Mg  Tabs (Montelukast Sodium) .... Take 1 By Mouth Qd 6)  Synthroid 150 Mcg  Tabs (Levothyroxine Sodium) .... Qd 7)  Benicar 40 Mg Tabs (Olmesartan Medoxomil) .Marland Kitchen.. 1 Qd 8)  Lipitor 20 Mg Tabs (Atorvastatin Calcium) .Marland Kitchen.. 1 At Bedtime 9)  Clarinex 5 Mg Tabs (Desloratadine) .Marland Kitchen.. 1 Qd 10)  Proair Hfa 108 (90 Base) Mcg/act Aers (Albuterol Sulfate) .... 2 Puffs Two Times A Day  Allergies (verified): 1)  ! Penicillin 2)  ! Sulfa 3)  ! * Asprin 4)  ! Fosamax 5)  ! Vasotec  Past History:  Past Medical History: Last updated: 09/08/2006 Allergic rhinitis Hypertension Hypothyroidism Osteoporosis Lactose intollerate Dyslipidemia Hyperglycemia/DM  Review of Systems  The patient denies fever.    Physical Exam  General:  normal appearance.   Extremities:  left  anterior tibial area:  has approx 13 cm longitudonal laceration, sutured.  there is 1 cm rim of erythema   Impression & Recommendations:  Problem # 1:  WOUND, LEG (ICD-891.0) Assessment Improved the sutures are removed, and antibiotic ointment is applied, and sterile dressing  Other Orders: Est. Patient Level III (28413)  Patient Instructions: 1)  elevate left leg (should be the highest point of your body), x 3 days. 2)  return if does not heal, or if worse. 3)  daily, remove the dressing, reapply antiobiotic ointment, and reapply a similar bandage, tape in place.

## 2010-03-08 NOTE — Letter (Signed)
Summary: Out of Work  [facility]  [address] [place], [place] [address]   Phone: [phone]  Fax: [phone]    September 07, 2009   Employee:  Gerica L Scallan    To Whom It May Concern:   For Medical reasons, please excuse the above named employee from work for the following dates:  Start:   09/05/09  End:   09/18/09         Sincerely,    Minus Breeding MD

## 2010-03-08 NOTE — Assessment & Plan Note (Signed)
Summary: cough, cold not getting better/#/cd   Vital Signs:  Patient profile:   70 year old female Height:      61 inches (154.94 cm) Weight:      161.8 pounds (73.55 kg) O2 Sat:      97 % on Room air Temp:     99.1 degrees F (37.28 degrees C) oral Pulse rate:   92 / minute BP sitting:   142 / 80  (left arm) Cuff size:   regular  Vitals Entered By: Orlan Leavens (February 20, 2009 9:13 AM)  O2 Flow:  Room air CC: cough not getting better Is Patient Diabetic? No Pain Assessment Patient in pain? no        Primary Care Provider:  Minus Breeding MD  CC:  cough not getting better.  History of Present Illness: prior OV 02/07/09 with PCP for 1 week of nasal congestion, and "post-nasal drip,"  but little or no cough.  no earache. tx with 7 d doxy and otc meds for symptoms control  today here with complaint of trouble breathing. onset of symptoms was 3 weeks ago. course has been sudden onset and now occurs in intermittent pattern. problem precipitated by  recent URI symptom characterized as dry cough, no sputum problem associated with coughing spasms and tightness but not associated with chest pain or fever. symptoms improved by rest and warm air. symptoms worsened with breathing cold air. + prior hx of same symptoms -?"borderline" asthma.   Current Medications (verified): 1)  Senokot S 8.6-50 Mg  Tabs (Sennosides-Docusate Sodium) .... Take 3 Qd 2)  Calcium 500/d 500-200 Mg-Unit  Tabs (Calcium Carbonate-Vitamin D) .... Take 1 By Mouth Bid 3)  Actos 15 Mg  Tabs (Pioglitazone Hcl) .... Take 1 By Mouth Qd 4)  Flonase 50 Mcg/act  Susp (Fluticasone Propionate) .... Use 1 Spray Each Nostril Qd 5)  Singulair 10 Mg  Tabs (Montelukast Sodium) .... Take 1 By Mouth Qd 6)  Synthroid 150 Mcg  Tabs (Levothyroxine Sodium) .... Qd 7)  Lipitor 20 Mg  Tabs (Atorvastatin Calcium) .... Qhs 8)  Benicar 40 Mg Tabs (Olmesartan Medoxomil) .Marland Kitchen.. 1 Qd  Allergies (verified): 1)  ! Penicillin 2)  !  Sulfa 3)  ! * Asprin 4)  ! Fosamax 5)  ! Vasotec  Past History:  Past Medical History: Last updated: 09/08/2006 Allergic rhinitis Hypertension Hypothyroidism Osteoporosis Lactose intollerate Dyslipidemia Hyperglycemia/DM  Review of Systems  The patient denies anorexia, fever, hoarseness, and headaches.    Physical Exam  General:  alert, well-developed, well-nourished, and cooperative to examination.    Nose:  External nasal examination shows no deformity or inflammation. Nasal mucosa are pink and moist without lesions or exudates. Mouth:  teeth and gums in good repair; mucous membranes moist, without lesions or ulcers. oropharynx clear without exudate, erythema.  Lungs:  normal respiratory effort, no intercostal retractions or use of accessory muscles; normal breath sounds bilaterally - no crackles and no wheezes.    Heart:  normal rate, regular rhythm, no murmur, and no rub. BLE without edema.   Impression & Recommendations:  Problem # 1:  COUGH (ICD-786.2)  ?cold trggered asthma flare - or post URI residual irritation? afeb, no sputum - doube active infx - tx with tessalon and as needed alb mdi - also daily PPI x 5days to help stop cough reflux avoid steroids given dm (at least at this time) - consider future pfts if persisitng problems  Orders: Prescription Created Electronically [phone])  Complete Medication List:  1)  Senokot S 8.6-50 Mg Tabs (Sennosides-docusate sodium) .... Take 3 qd 2)  Calcium 500/d 500-200 Mg-unit Tabs (Calcium carbonate-vitamin d) .... Take 1 by mouth bid 3)  Actos 15 Mg Tabs (Pioglitazone hcl) .... Take 1 by mouth qd 4)  Flonase 50 Mcg/act Susp (Fluticasone propionate) .... Use 1 spray each nostril qd 5)  Singulair 10 Mg Tabs (Montelukast sodium) .... Take 1 by mouth qd 6)  Synthroid 150 Mcg Tabs (Levothyroxine sodium) .... Qd 7)  Lipitor 20 Mg Tabs (atorvastatin Calcium)  .... Qhs 8)  Benicar 40 Mg Tabs (Olmesartan medoxomil) .Marland Kitchen.. 1  qd 9)  Tessalon 200 Mg Caps (Benzonatate) .Marland Kitchen.. 1 by mouth three times a day as needed for cough (or as directed) 10)  Proair Hfa 108 (90 Base) Mcg/act Aers (Albuterol sulfate) .... 2 puffs every 4 hours as needed for cough and shortness of breath  Patient Instructions: 1)  it was good to see you today.  2)  2 medications for your cough - tessalon and rescue inhaler - your prescriptions have been electronically submitted to your pharmacy. Please take as directed. Contact our office if you believe you're having problems with the medication(s).  3)  also take daily priolosec otc for next 5 days to help minimize reflux irritation caused by coughing (even if you do not feel any reflux symptoms) 4)  Please schedule a follow-up appointment as needed or as scheduled. Prescriptions: PROAIR HFA 108 (90 BASE) MCG/ACT AERS (ALBUTEROL SULFATE) 2 puffs every 4 hours as needed for cough and shortness of breath  #1 x 1   Entered and Authorized by:   Newt Lukes MD   Signed by:   Newt Lukes MD on 02/20/2009   Method used:   Electronically to        [facility]  [phone]* (retail)       [address] [place], [place]  [address]       Ph: [phone] or [phone]       Fax: [phone]   RxID:   [id number] TESSALON 200 MG CAPS (BENZONATATE) 1 by mouth three times a day as needed for cough (or as directed)  #30 x 0   Entered and Authorized by:   Newt Lukes MD   Signed by:   Newt Lukes MD on 02/20/2009   Method used:   Electronically to        [facility]  [phone]* (retail)       [address] [place], [place]  [address]       Ph: [phone] or [phone]       Fax: [phone]   RxID:   [id number]

## 2010-03-14 NOTE — Progress Notes (Signed)
Summary: FYI  Phone Note Call from Patient   Caller: Patient [phone] Summary of Call: pt called to inform MD that she has colonoscopy at [facility] by Dr Matthias Hughs. Initial call taken by: Margaret Pyle, CMA,  March 05, 2010 4:43 PM  Follow-up for Phone Call        i reviewed flex sig, and signed for scanning Follow-up by: Minus Breeding MD,  March 07, 2010 10:22 AM

## 2010-03-14 NOTE — Miscellaneous (Signed)
Summary: BONE DENSITY  Clinical Lists Changes  Orders: Added new Test order of T-Lumbar Vertebral Assessment (77082) - Signed 

## 2010-03-14 NOTE — Progress Notes (Signed)
Summary: refills  Phone Note Call from Patient   Caller: Patient Summary of Call: Pt wants hard copies of rx for 1 year supply so that she can sent to mail order pharmacy..copies of rx waiting on MD's signature Initial call taken by: Brenton Grills CMA Duncan Dull),  March 05, 2010 9:18 AM  Follow-up for Phone Call        rx's signed by MD. Pt informed Follow-up by: Brenton Grills CMA Duncan Dull),  March 05, 2010 9:21 AM    Prescriptions: PROAIR HFA 108 (90 BASE) MCG/ACT AERS (ALBUTEROL SULFATE) 2 puffs two times a day  #3 x 3   Entered by:   Brenton Grills CMA (AAMA)   Authorized by:   Minus Breeding MD   Signed by:   Brenton Grills CMA (AAMA) on 03/05/2010   Method used:   Print then Give to Patient   RxID:   [id number] CLARINEX 5 MG TABS (DESLORATADINE) 1 qd  #90 x 3   Entered by:   Brenton Grills CMA (AAMA)   Authorized by:   Minus Breeding MD   Signed by:   Brenton Grills CMA (AAMA) on 03/05/2010   Method used:   Print then Give to Patient   RxID:   [id number] LIPITOR 20 MG TABS (ATORVASTATIN CALCIUM) 1 at bedtime  #90 x 3   Entered by:   Brenton Grills CMA (AAMA)   Authorized by:   Minus Breeding MD   Signed by:   Brenton Grills CMA (AAMA) on 03/05/2010   Method used:   Print then Give to Patient   RxID:   [id number] BENICAR 40 MG TABS (OLMESARTAN MEDOXOMIL) 1 qd  #90 Tablet x 3   Entered by:   Brenton Grills CMA (AAMA)   Authorized by:   Minus Breeding MD   Signed by:   Brenton Grills CMA (AAMA) on 03/05/2010   Method used:   Print then Give to Patient   RxID:   [id number] SYNTHROID 150 MCG  TABS (LEVOTHYROXINE SODIUM) qd  #90 x 3   Entered by:   Brenton Grills CMA (AAMA)   Authorized by:   Minus Breeding MD   Signed by:   Brenton Grills CMA (AAMA) on 03/05/2010   Method used:   Print then Give to Patient   RxID:   [id number] SINGULAIR 10 MG  TABS (MONTELUKAST SODIUM) take 1 by mouth qd  #90 x 3   Entered by:   Brenton Grills CMA (AAMA)  Authorized by:   Minus Breeding MD   Signed by:   Brenton Grills CMA (AAMA) on 03/05/2010   Method used:   Print then Give to Patient   RxID:   [id number] FLONASE 50 MCG/ACT  SUSP (FLUTICASONE PROPIONATE) use 1 spray each nostril qd  #3 x 3   Entered by:   Brenton Grills CMA (AAMA)   Authorized by:   Minus Breeding MD   Signed by:   Brenton Grills CMA (AAMA) on 03/05/2010   Method used:   Print then Give to Patient   RxID:   [id number]

## 2010-03-14 NOTE — Assessment & Plan Note (Signed)
Summary: yearly medicare physical-lb   Vital Signs:  Patient profile:   70 year old female Height:      61 inches (154.94 cm) Weight:      161 pounds (73.18 kg) BMI:     30.53 O2 Sat:      97 % on Room air Temp:     98.6 degrees F (37.00 degrees C) oral Pulse rate:   86 / minute Pulse rhythm:   regular BP sitting:   112 / 72  (left arm) Cuff size:   regular  Vitals Entered By: Brenton Grills CMA Duncan Dull) (March 05, 2010 8:17 AM)  O2 Flow:  Room air CC: Yearly Medicare Wellness/aj Is Patient Diabetic? No   Primary Provider:  Minus Breeding MD  CC:  Yearly Medicare Wellness/aj.  History of Present Illness: here for regular wellness examination.  He's feeling pretty well in general, and does not smoke.   Current Medications (verified): 1)  Senokot S 8.6-50 Mg  Tabs (Sennosides-Docusate Sodium) .... Take 3 Qd 2)  Calcium 500/d 500-200 Mg-Unit  Tabs (Calcium Carbonate-Vitamin D) .... Take 1 By Mouth Bid 3)  Actos 15 Mg  Tabs (Pioglitazone Hcl) .... Take 1 By Mouth Qd 4)  Flonase 50 Mcg/act  Susp (Fluticasone Propionate) .... Use 1 Spray Each Nostril Qd 5)  Singulair 10 Mg  Tabs (Montelukast Sodium) .... Take 1 By Mouth Qd 6)  Synthroid 150 Mcg  Tabs (Levothyroxine Sodium) .... Qd 7)  Benicar 40 Mg Tabs (Olmesartan Medoxomil) .Marland Kitchen.. 1 Qd 8)  Lipitor 20 Mg Tabs (Atorvastatin Calcium) .Marland Kitchen.. 1 At Bedtime 9)  Clarinex 5 Mg Tabs (Desloratadine) .Marland Kitchen.. 1 Qd 10)  Proair Hfa 108 (90 Base) Mcg/act Aers (Albuterol Sulfate) .... 2 Puffs Two Times A Day 11)  Hydrocodone-Acetaminophen 5-325 Mg Tabs (Hydrocodone-Acetaminophen) .... Take 1-2 Tablets By Mouth Every 4-6 Hrs As Needed For Pain 12)  Clindamycin Hcl 300 Mg Caps (Clindamycin Hcl) .Marland Kitchen.. 1 Capsule By Mouth Two Times A Day For 7 Days  Allergies (verified): 1)  ! Penicillin 2)  ! Sulfa 3)  ! * Asprin 4)  ! Fosamax 5)  ! Vasotec  Family History: Reviewed history from 02/10/2007 and no changes required. no cancer  Social  History: Reviewed history from 02/10/2007 and no changes required. widowed works as Water engineer alcohol: occasional only  Physical Exam  General:  normal appearance.   Head:  head: no deformity eyes: no periorbital swelling, no proptosis external nose and ears are normal mouth: no lesion seen Neck:  Supple without thyroid enlargement or tenderness.  Breasts:  No tenderness, masses, nipple discharge, or skin abnormalities.  Lungs:  Clear to auscultation bilaterally. Normal respiratory effort.  Heart:  Regular rate and rhythm without murmurs or gallops noted. Normal S1,S2.   Abdomen:  abdomen is soft, nontender.  no hepatosplenomegaly.   not distended.  no hernia  Genitalia:  Pelvic Exam:        External: normal female genitalia without lesions or masses        Vagina: normal without lesions or masses        Cervix: normal without lesions or masses        Adnexa: normal bimanual exam without masses or fullness        Uterus: normal by palpation Msk:  muscle bulk and strength are grossly normal.  no obvious joint swelling.   Pulses:  dorsalis pedis intact bilat.  no carotid bruit  Extremities:  no deformity.  no ulcer on the feet.  feet are of normal color and temp.  no edema  Neurologic:  cn 2-12 grossly intact.   readily moves all 4's.   sensation is intact to touch on the feet  Skin:  normal texture and temp.  no rash.  not diaphoretic  Cervical Nodes:  No significant adenopathy.  Psych:  Alert and cooperative; normal mood and affect; normal attention span and concentration.   Additional Exam:  SEPARATE EVALUATION FOLLOWS--EACH PROBLEM HERE IS NEW, NOT RESPONDING TO TREATMENT, OR POSES SIGNIFICANT RISK TO THE PATIENT'S HEALTH: HISTORY OF THE PRESENT ILLNESS: pt is noted on labs to have anemia.  she says she has never had this before.  she has fatigue PAST MEDICAL HISTORY reviewed and up to date today REVIEW OF SYSTEMS: denies brbpr PHYSICAL EXAMINATION: gait: normal  and steady skin: no ecchymosis rectal: normal external and internal exam.  heme neg LAB/XRAY RESULTS: IMPRESSION: anemia, new.  uncertain etiology PLAN: see instruction sheet   Impression & Recommendations:  Problem # 1:  ROUTINE GENERAL MEDICAL EXAM@HEALTH  CARE FACL (ICD-V70.0)  Other Orders: EKG w/ Interpretation (93000) TLB-CBC Platelet - w/Differential (85025-CBCD) TLB-IBC Pnl (Iron/FE;Transferrin) (83550-IBC) TLB-B12, Serum-Total ONLY (82607-B12) TLB-A1C / Hgb A1C (Glycohemoglobin) (83036-A1C) Est. Patient Level III (16109) Est. Patient 65& > (60454) T-Bone Densitometry (09811)  Preventive Care Screening  Mammogram:    Date:  11/02/2009    Results:  normal    Patient Instructions: 1)  please consider these measures for your health:  minimize alcohol.  do not use tobacco products.  have a colonoscopy at least every 10 years from age 85.  keep firearms safely stored.  always use seat belts.  have working smoke alarms in your home.  see an eye doctor and dentist regularly.  never drive under the influence of alcohol or drugs (including prescription drugs).  those with fair skin should take precautions against the sun. 2)  please let me know what your wishes would be, if artificial life support measures should become necessary.  it is critically important to prevent falling down (keep floor areas well-lit, dry, and free of loose objects). 3)  blood tests are being ordered for you today.  please call [phone] to hear your test results. 4)  Please schedule a follow-up appointment in 1 year. 5)  (update: we discussed code status.  pt requests full code, but would not want to be started or maintained on artificial life-support measures if there was not a reasonable chance of recovery)   Orders Added: 1)  EKG w/ Interpretation [93000] 2)  TLB-CBC Platelet - w/Differential [85025-CBCD] 3)  TLB-IBC Pnl (Iron/FE;Transferrin) [83550-IBC] 4)  TLB-B12, Serum-Total ONLY  [82607-B12] 5)  TLB-A1C / Hgb A1C (Glycohemoglobin) [83036-A1C] 6)  Est. Patient Level III [56213] 7)  Est. Patient 65& > [99397] 8)  T-Bone Densitometry [08657]   Immunization History:  Influenza Immunization History:    Influenza:  historical (11/04/2009)   Immunization History:  Influenza Immunization History:    Influenza:  Historical (11/04/2009)  Preventive Care Screening  Mammogram:    Date:  11/02/2009    Results:  normal       Preventive Care Screening  Mammogram:    Date:  11/02/2009    Results:  normal

## 2010-03-15 ENCOUNTER — Ambulatory Visit (INDEPENDENT_AMBULATORY_CARE_PROVIDER_SITE_OTHER): Payer: BC Managed Care – PPO | Admitting: Internal Medicine

## 2010-03-15 ENCOUNTER — Ambulatory Visit (INDEPENDENT_AMBULATORY_CARE_PROVIDER_SITE_OTHER)
Admission: RE | Admit: 2010-03-15 | Discharge: 2010-03-15 | Disposition: A | Discharge status: Home / Self Care | Payer: BC Managed Care – PPO | Source: Ambulatory Visit | Attending: Internal Medicine | Admitting: Internal Medicine

## 2010-03-15 ENCOUNTER — Encounter: Payer: Self-pay | Admitting: Internal Medicine

## 2010-03-15 ENCOUNTER — Other Ambulatory Visit: Payer: Self-pay | Admitting: Internal Medicine

## 2010-03-15 DIAGNOSIS — J45901 Unspecified asthma with (acute) exacerbation: Secondary | ICD-10-CM

## 2010-03-15 DIAGNOSIS — R05 Cough: Secondary | ICD-10-CM

## 2010-03-15 DIAGNOSIS — R059 Cough, unspecified: Secondary | ICD-10-CM

## 2010-03-15 DIAGNOSIS — J209 Acute bronchitis, unspecified: Secondary | ICD-10-CM

## 2010-03-15 DIAGNOSIS — I10 Essential (primary) hypertension: Secondary | ICD-10-CM

## 2010-03-15 HISTORY — DX: Unspecified asthma with (acute) exacerbation: J45.901

## 2010-03-16 ENCOUNTER — Telehealth: Payer: Self-pay | Admitting: Endocrinology

## 2010-03-22 NOTE — Assessment & Plan Note (Signed)
Summary: sinus inf?sae   Vital Signs:  Patient profile:   70 year old female Menstrual status:  postmenopausal Height:      61 inches Weight:      160 pounds BMI:     30.34 O2 Sat:      96 % on Room air Temp:     98.7 degrees F oral Pulse rate:   81 / minute Pulse rhythm:   regular Resp:     16 per minute BP sitting:   130 / 72  (left arm) Cuff size:   regular  Vitals Entered By: Rock Nephew CMA (March 15, 2010 11:27 AM)  Nutrition Counseling: Patient's BMI is greater than 25 and therefore counseled on weight management options.  O2 Flow:  Room air CC: URI symptoms     Menstrual Status postmenopausal   Primary Care Provider:  Minus Breeding MD  CC:  URI symptoms.  History of Present Illness:  URI Symptoms      This is a 70 year old woman who presents with URI symptoms.  The symptoms began 3 days ago.  The severity is described as moderate.  The patient reports nasal congestion, purulent nasal discharge, sore throat, and productive cough, but denies clear nasal discharge, dry cough, earache, and sick contacts.  Associated symptoms include dyspnea and wheezing.  The patient denies fever, stiff neck, rash, vomiting, diarrhea, use of an antipyretic, and response to antipyretic.  The patient also reports sneezing and seasonal symptoms.  The patient denies headache, muscle aches, and severe fatigue.  The patient denies the following risk factors for Strep sinusitis: unilateral facial pain, unilateral nasal discharge, poor response to decongestant, double sickening, tooth pain, Strep exposure, tender adenopathy, and absence of cough.    Asthma History    Initial Asthma Severity Rating:    Age range: 12+ years    Symptoms: daily    Nighttime Awakenings: 3-4/month    Interferes w/ normal activity: minor limitations    SABA use (not for EIB): daily    Asthma Severity Assessment: Moderate Persistent   Preventive Screening-Counseling & Management  Alcohol-Tobacco  Alcohol drinks/day: 0     Alcohol Counseling: not indicated; patient does not drink     Smoking Status: quit     Smoking Cessation Counseling: yes     Year Quit: 1987     Tobacco Counseling: to remain off tobacco products  Caffeine-Diet-Exercise     Does Patient Exercise: no      Drug Use:  no.    Allergies: 1)  ! Penicillin 2)  ! Sulfa 3)  ! * Asprin 4)  ! Fosamax 5)  ! Vasotec  Past History:  Past Medical History: Last updated: 09/08/2006 Allergic rhinitis Hypertension Hypothyroidism Osteoporosis Lactose intollerate Dyslipidemia Hyperglycemia/DM  Past Surgical History: Last updated: 09/08/2006 Breast Bx (1976) EKG (10/102007) DEXA (2006)  Family History: Last updated: 02/10/2007 no cancer  Social History: Last updated: 03/15/2010 widowed works as Water engineer alcohol: occasional only Alcohol use-no Drug use-no Regular exercise-no  Risk Factors: Smoking Status: quit (03/15/2010)  Family History: Reviewed history from 02/10/2007 and no changes required. no cancer  Social History: Reviewed history from 03/05/2010 and no changes required. widowed works as Water engineer alcohol: occasional only Alcohol use-no Drug use-no Regular exercise-no Drug Use:  no Does Patient Exercise:  no  Review of Systems       The patient complains of prolonged cough.  The patient denies anorexia, fever, weight loss, weight gain, hoarseness, chest pain,  syncope, dyspnea on exertion, peripheral edema, headaches, hemoptysis, abdominal pain, hematuria, suspicious skin lesions, abnormal bleeding, enlarged lymph nodes, and angioedema.   Resp:  Complains of cough, shortness of breath, sputum productive, and wheezing; denies chest discomfort, chest pain with inspiration, coughing up blood, excessive snoring, hypersomnolence, morning headaches, and pleuritic.  Physical Exam  General:  alert, well-developed, well-nourished, well-hydrated, appropriate dress, cooperative to  examination, good hygiene, and mild distress with audible wheezing when she speaks but she is moving air well. Head:  normocephalic, atraumatic, no abnormalities observed, and no abnormalities palpated.   Eyes:  vision grossly intact, pupils equal, pupils round, and pupils reactive to light.   Ears:  R ear normal and L ear normal.   Nose:  no external deformity, no airflow obstruction, no intranasal foreign body, no nasal polyps, no nasal mucosal lesions, no mucosal friability, no active bleeding or clots, no sinus percussion tenderness, no septum abnormalities, nasal dischargemucosal pallor, and mucosal edema.   Mouth:  Oral mucosa and oropharynx without lesions or exudates.  Teeth in good repair. Neck:  supple, full ROM, no masses, no thyromegaly, no thyroid nodules or tenderness, no JVD, normal carotid upstroke, no carotid bruits, no cervical lymphadenopathy, and no neck tenderness.   Lungs:  normal respiratory effort, no intercostal retractions, no accessory muscle use, no dullness, no fremitus, and R base crackles and faint bilateral late inspiratory wheezes but good air movement. Heart:  normal rate, regular rhythm, no murmur, no gallop, no rub, no JVD, and no HJR.   Abdomen:  soft, non-tender, normal bowel sounds, no distention, no masses, no guarding, no rigidity, no rebound tenderness, no abdominal hernia, no inguinal hernia, no hepatomegaly, and no splenomegaly.   Msk:  No deformity or scoliosis noted of thoracic or lumbar spine.   Pulses:  R and L carotid,radial,femoral,dorsalis pedis and posterior tibial pulses are full and equal bilaterally Extremities:  No clubbing, cyanosis, edema, or deformity noted with normal full range of motion of all joints.   Neurologic:  No cranial nerve deficits noted. Station and gait are normal. Plantar reflexes are down-going bilaterally. DTRs are symmetrical throughout. Sensory, motor and coordinative functions appear intact. Skin:  Intact without suspicious  lesions or rashes Cervical Nodes:  no anterior cervical adenopathy and no posterior cervical adenopathy.   Axillary Nodes:  no R axillary adenopathy and no L axillary adenopathy.   Inguinal Nodes:  no R inguinal adenopathy and no L inguinal adenopathy.   Psych:  Cognition and judgment appear intact. Alert and cooperative with normal attention span and concentration. No apparent delusions, illusions, hallucinations   Impression & Recommendations:  Problem # 1:  COUGH (ICD-786.2) Assessment New Will check for pna, masses, edema, etc.. Also will check PFT's. Orders: T-2 View CXR (71020TC) Pulmonary Referral (Pulmonary) Admin of Therapeutic Inj  intramuscular or subcutaneous (19147) Depo- Medrol 40mg  (J1030) Depo- Medrol 80mg  (J1040)  Problem # 2:  ASTHMA NOS W/ACUTE EXACERBATION (WGN-562.13) Assessment: New  Her updated medication list for this problem includes:    Singulair 10 Mg Tabs (Montelukast sodium) .Marland Kitchen... Take 1 by mouth qd    Proair Hfa 108 (90 Base) Mcg/act Aers (Albuterol sulfate) .Marland Kitchen... 2 puffs two times a day    Dulera 100-5 Mcg/act Aero (Mometasone furo-formoterol fum) .Marland Kitchen... 2 puffs two times a day for asthma  Orders: Pulmonary Referral (Pulmonary)  Pulmonary Functions Reviewed: O2 sat: 96 (03/15/2010)  Problem # 3:  BRONCHITIS-ACUTE (ICD-466.0) Assessment: New  Her updated medication list for this problem includes:  Singulair 10 Mg Tabs (Montelukast sodium) .Marland Kitchen... Take 1 by mouth qd    Proair Hfa 108 (90 Base) Mcg/act Aers (Albuterol sulfate) .Marland Kitchen... 2 puffs two times a day    Avelox Abc Pack 400 Mg Tabs (Moxifloxacin hcl) ..... One by mouth once daily for 5 days    Zutripro 60-4-5 Mg/66ml Soln (Pseudoeph-chlorphen-hydrocod) .Marland Kitchen... 1 tsp by mouth qid as needed for cough/congestion    Dulera 100-5 Mcg/act Aero (Mometasone furo-formoterol fum) .Marland Kitchen... 2 puffs two times a day for asthma  Problem # 4:  HYPERTENSION (ICD-401.9) Assessment: Unchanged  Her updated  medication list for this problem includes:    Benicar 40 Mg Tabs (Olmesartan medoxomil) .Marland Kitchen... 1 qd  BP today: 130/72 Prior BP: 112/72 (03/05/2010)  Labs Reviewed: K+: 4.4 (02/26/2010) Creat: : 0.6 (02/26/2010)   Chol: 177 (02/26/2010)   HDL: 52.10 (02/26/2010)   LDL: 103 (02/26/2010)   TG: 111.0 (02/26/2010)  Complete Medication List: 1)  Senokot S 8.6-50 Mg Tabs (Sennosides-docusate sodium) .... Take 3 qd 2)  Calcium 500/d 500-200 Mg-unit Tabs (Calcium carbonate-vitamin d) .... Take 1 by mouth bid 3)  Actos 15 Mg Tabs (Pioglitazone hcl) .... Take 1 by mouth qd 4)  Flonase 50 Mcg/act Susp (Fluticasone propionate) .... Use 1 spray each nostril qd 5)  Singulair 10 Mg Tabs (Montelukast sodium) .... Take 1 by mouth qd 6)  Synthroid 150 Mcg Tabs (Levothyroxine sodium) .... Qd 7)  Benicar 40 Mg Tabs (Olmesartan medoxomil) .Marland Kitchen.. 1 qd 8)  Lipitor 20 Mg Tabs (Atorvastatin calcium) .Marland Kitchen.. 1 at bedtime 9)  Clarinex 5 Mg Tabs (Desloratadine) .Marland Kitchen.. 1 qd 10)  Proair Hfa 108 (90 Base) Mcg/act Aers (Albuterol sulfate) .... 2 puffs two times a day 11)  Avelox Abc Pack 400 Mg Tabs (Moxifloxacin hcl) .... One by mouth once daily for 5 days 12)  Zutripro 60-4-5 Mg/39ml Soln (Pseudoeph-chlorphen-hydrocod) .Marland Kitchen.. 1 tsp by mouth qid as needed for cough/congestion 13)  Dulera 100-5 Mcg/act Aero (Mometasone furo-formoterol fum) .... 2 puffs two times a day for asthma  Patient Instructions: 1)  Please schedule a follow-up appointment in 2 weeks. 2)  It is important that you exercise regularly at least 20 minutes 5 times a week. If you develop chest pain, have severe difficulty breathing, or feel very tired , stop exercising immediately and seek medical attention. 3)  You need to lose weight. Consider a lower calorie diet and regular exercise.  4)  Check your Blood Pressure regularly. If it is above 130/80: you should make an appointment. 5)  Take your antibiotic as prescribed until ALL of it is gone, but stop if you  develop a rash or swelling and contact our office as soon as possible. 6)  Acute bronchitis symptoms for less than 10 days are not helped by antibiotics. take over the counter cough medications. call if no improvment in  5-7 days, sooner if increasing cough, fever, or new symptoms( shortness of breath, chest pain). Prescriptions: DULERA 100-5 MCG/ACT AERO (MOMETASONE FURO-FORMOTEROL FUM) 2 puffs two times a day for asthma  #8 inhs x 0   Entered and Authorized by:   Etta Grandchild MD   Signed by:   Etta Grandchild MD on 03/15/2010   Method used:   Samples Given   RxID:   [id number] ZUTRIPRO 60-4-5 MG/5ML SOLN (PSEUDOEPH-CHLORPHEN-HYDROCOD) 1 tsp by mouth QID as needed for cough/congestion  #60 ml x 0   Entered and Authorized by:   Etta Grandchild MD   Signed by:  Etta Grandchild MD on 03/15/2010   Method used:   Samples Given   RxID:   [id number] AVELOX ABC PACK 400 MG TABS (MOXIFLOXACIN HCL) One by mouth once daily for 5 days  #5 x 0   Entered and Authorized by:   Etta Grandchild MD   Signed by:   Etta Grandchild MD on 03/15/2010   Method used:   Samples Given   RxID:   [id number]    Medication Administration  Injection # 1:    Medication: Depo- Medrol 80mg     Diagnosis: COUGH (ICD-786.2)    Route: IM    Site: R deltoid    Exp Date: 08/2012    Lot #: [id number]    Mfr: pfizer    Patient tolerated injection without complications    Given by: Rock Nephew CMA (March 15, 2010 11:55 AM)  Injection # 2:    Medication: Depo- Medrol 40mg     Diagnosis: COUGH (ICD-786.2)    Route: IM    Site: R deltoid    Exp Date: 08/2012    Lot #: [id number]    Mfr: pfizer    Patient tolerated injection without complications  Orders Added: 1)  T-2 View CXR [71020TC] 2)  Pulmonary Referral [Pulmonary] 3)  Admin of Therapeutic Inj  intramuscular or subcutaneous [96372] 4)  Depo- Medrol 40mg  [J1030] 5)  Depo- Medrol 80mg  [J1040] 6)  Est. Patient Level IV  [25366]     Medication Administration  Injection # 1:    Medication: Depo- Medrol 80mg     Diagnosis: COUGH (ICD-786.2)    Route: IM    Site: R deltoid    Exp Date: 08/2012    Lot #: [id number]    Mfr: pfizer    Patient tolerated injection without complications    Given by: Rock Nephew CMA (March 15, 2010 11:55 AM)  Injection # 2:    Medication: Depo- Medrol 40mg     Diagnosis: COUGH (ICD-786.2)    Route: IM    Site: R deltoid    Exp Date: 08/2012    Lot #: [id number]    Mfr: pfizer    Patient tolerated injection without complications  Orders Added: 1)  T-2 View CXR [71020TC] 2)  Pulmonary Referral [Pulmonary] 3)  Admin of Therapeutic Inj  intramuscular or subcutaneous [96372] 4)  Depo- Medrol 40mg  [J1030] 5)  Depo- Medrol 80mg  [J1040] 6)  Est. Patient Level IV [44034]

## 2010-03-22 NOTE — Letter (Signed)
Summary: Out of Work  [facility]  [address] [place], [place] [address]   Phone: [phone]  Fax: [phone]    March 15, 2010   Employee:  Kelly Hardin    To Whom It May Concern:   For Medical reasons, please excuse the above named employee from work for the following dates:  Start:   03/14/10  End:   03/19/10  If you need additional information, please feel free to contact our office.         Sincerely,    Etta Grandchild MD

## 2010-03-22 NOTE — Progress Notes (Signed)
Summary: referral  Phone Note Call from Patient   Caller: Patient Summary of Call: Patient called stating that she was seen by Dr. Yetta Barre yesterday and feels better today. She said that she received a call regarding pulmonary testing appt and  she thinks that she has had one in the past. Per pt, she only needed medication to  avoid pneumona and would like to know what Dr Everardo All thinks about needing this appt. Please advise thanks.Marland KitchenMarland KitchenAlvy Beal Archie CMA  March 16, 2010 1:33 PM   Follow-up for Phone Call        it is up to you, but i think the test is a good idea Follow-up by: Minus Breeding MD,  March 16, 2010 2:13 PM  Additional Follow-up for Phone Call Additional follow up Details #1::        Notified pt with md response. pt states she has had PFT done in the past not wanting to do test. Have appt in 2 weeks to f/u with Dr. Everardo All Additional Follow-up by: Orlan Leavens RMA,  March 16, 2010 2:29 PM

## 2010-03-22 NOTE — Letter (Signed)
Summary: [facility]  [facility]   Imported By: Sherian Rein 03/12/2010 12:11:34  _____________________________________________________________________  External Attachment:    Type:   Image     Comment:   External Document

## 2010-03-29 ENCOUNTER — Encounter: Payer: Self-pay | Admitting: Endocrinology

## 2010-03-29 ENCOUNTER — Ambulatory Visit (INDEPENDENT_AMBULATORY_CARE_PROVIDER_SITE_OTHER): Payer: BC Managed Care – PPO | Admitting: Endocrinology

## 2010-03-29 DIAGNOSIS — J209 Acute bronchitis, unspecified: Secondary | ICD-10-CM

## 2010-04-03 ENCOUNTER — Telehealth: Payer: Self-pay | Admitting: Endocrinology

## 2010-04-03 NOTE — Assessment & Plan Note (Signed)
Summary: 2 wk f/u #/cd   Vital Signs:  Patient profile:   70 year old female Menstrual status:  postmenopausal Height:      61 inches (154.94 cm) Weight:      159.38 pounds (72.45 kg) BMI:     30.22 O2 Sat:      97 % on Room air Temp:     98.4 degrees F (36.89 degrees C) oral Pulse rate:   100 / minute Pulse rhythm:   regular BP sitting:   128 / 70  (left arm) Cuff size:   regular  Vitals Entered By: Brenton Grills CMA Duncan Dull) (March 29, 2010 1:09 PM)  O2 Flow:  Room air CC: Reoccuring sinus infection/aj Is Patient Diabetic? Yes   Primary Provider:  Minus Breeding MD  CC:  Reoccuring sinus infection/aj.  History of Present Illness: since pt was seen here a few weeks ago, she says there is no improvement.  main sxs are dry cough, rhinorrhea, wheezing.  she says she gets this illness approx once a year.  otherwise, she uses the "pro-air" less then twice a week.    Current Medications (verified): 1)  Senokot S 8.6-50 Mg  Tabs (Sennosides-Docusate Sodium) .... Take 3 Qd 2)  Calcium 500/d 500-200 Mg-Unit  Tabs (Calcium Carbonate-Vitamin D) .... Take 1 By Mouth Bid 3)  Actos 15 Mg  Tabs (Pioglitazone Hcl) .... Take 1 By Mouth Qd 4)  Flonase 50 Mcg/act  Susp (Fluticasone Propionate) .... Use 1 Spray Each Nostril Qd 5)  Singulair 10 Mg  Tabs (Montelukast Sodium) .... Take 1 By Mouth Qd 6)  Synthroid 150 Mcg  Tabs (Levothyroxine Sodium) .... Qd 7)  Benicar 40 Mg Tabs (Olmesartan Medoxomil) .Marland Kitchen.. 1 Qd 8)  Lipitor 20 Mg Tabs (Atorvastatin Calcium) .Marland Kitchen.. 1 At Bedtime 9)  Clarinex 5 Mg Tabs (Desloratadine) .Marland Kitchen.. 1 Qd 10)  Proair Hfa 108 (90 Base) Mcg/act Aers (Albuterol Sulfate) .... 2 Puffs Two Times A Day  Allergies (verified): 1)  ! Penicillin 2)  ! Sulfa 3)  ! * Asprin 4)  ! Fosamax 5)  ! Vasotec  Past History:  Past Medical History: Last updated: 09/08/2006 Allergic rhinitis Hypertension Hypothyroidism Osteoporosis Lactose  intollerate Dyslipidemia Hyperglycemia/DM  Review of Systems  The patient denies fever.         no earache or sore throat.    Physical Exam  General:  normal appearance.   Head:  head: no deformity eyes: no periorbital swelling, no proptosis external nose and ears are normal mouth: no lesion seen Ears:  right tm is normal.  left has minimal erythema Lungs:  Clear to auscultation bilaterally. Normal respiratory effort.    Impression & Recommendations:  Problem # 1:  BRONCHITIS-ACUTE (ICD-466.0) Assessment Unchanged  Medications Added to Medication List This Visit: 1)  Promethazine-codeine 6.25-10 Mg/64ml Syrp (Promethazine-codeine) .Marland Kitchen.. 1-2 teaspoons every 4 hrs as needed for cough  Other Orders: Est. Patient Level III (60454)  Patient Instructions: 1)  here is a sample of "advair-100."  take 1 puff two times a day.  rinse mouth after using.   2)  take "chlor-trimeton" 4 mg at bedtime.  you can still take clarinex each morning. 3)  here is a prescription for cough syrup. 4)  call next week if you are not better Prescriptions: PROMETHAZINE-CODEINE 6.25-10 MG/5ML SYRP (PROMETHAZINE-CODEINE) 1-2 teaspoons every 4 hrs as needed for cough  #8 oz x 1   Entered and Authorized by:   Minus Breeding MD   Signed by:  Minus Breeding MD on 03/29/2010   Method used:   Print then Give to Patient   RxID:   [id number]    Orders Added: 1)  Est. Patient Level III [53664]

## 2010-04-09 ENCOUNTER — Telehealth: Payer: Self-pay | Admitting: Endocrinology

## 2010-04-12 NOTE — Progress Notes (Signed)
Summary: Rx req  Phone Note Call from Patient   Caller: Patient [phone] Summary of Call: Pt calledto inform MD that her breathing has been greatly improved since starting sample of Adviar . Pt is requesting Rx to [facility] of Advair 100 1 puff two times a day. Initial call taken by: Margaret Pyle, CMA,  April 03, 2010 11:06 AM  Follow-up for Phone Call        sent Follow-up by: Minus Breeding MD,  April 03, 2010 11:17 AM    New/Updated Medications: ADVAIR DISKUS 100-50 MCG/DOSE AEPB (FLUTICASONE-SALMETEROL) 1 puff two times a day Prescriptions: ADVAIR DISKUS 100-50 MCG/DOSE AEPB (FLUTICASONE-SALMETEROL) 1 puff two times a day  #3 mos x 3   Entered and Authorized by:   Minus Breeding MD   Signed by:   Minus Breeding MD on 04/03/2010   Method used:   Electronically to        [facility]* (retail)             ,          Ph: [phone]       Fax: [phone]   RxID:   [id number]

## 2010-04-17 NOTE — Progress Notes (Signed)
Summary: Rx refill req  Phone Note Refill Request Message from:  Patient on April 09, 2010 10:15 AM  Refills Requested: Medication #1:  BENICAR 40 MG TABS 1 qd   Dosage confirmed as above?Dosage Confirmed   Supply Requested: 6 months  Method Requested: Fax to [facility] Initial call taken by: Margaret Pyle, CMA,  April 09, 2010 10:15 AM    Prescriptions: BENICAR 40 MG TABS (OLMESARTAN MEDOXOMIL) 1 qd  #90 Tablet x 3   Entered by:   Margaret Pyle, CMA   Authorized by:   Minus Breeding MD   Signed by:   Margaret Pyle, CMA on 04/09/2010   Method used:   Electronically to        [facility]* (retail)             ,          Ph: [phone]       Fax: [phone]   RxID:   [id number]

## 2010-04-27 ENCOUNTER — Other Ambulatory Visit (INDEPENDENT_AMBULATORY_CARE_PROVIDER_SITE_OTHER): Payer: BC Managed Care – PPO | Admitting: Endocrinology

## 2010-04-27 DIAGNOSIS — E039 Hypothyroidism, unspecified: Secondary | ICD-10-CM

## 2010-05-03 ENCOUNTER — Other Ambulatory Visit: Payer: Self-pay | Admitting: Endocrinology

## 2010-08-21 ENCOUNTER — Other Ambulatory Visit: Payer: Self-pay | Admitting: *Deleted

## 2010-08-21 MED ORDER — DESLORATADINE 5 MG PO TABS: 5.0000 mg | ORAL_TABLET | Freq: Every day | ORAL | Status: DC

## 2010-08-21 MED ORDER — PIOGLITAZONE HCL 15 MG PO TABS: 15.0000 mg | ORAL_TABLET | Freq: Every day | ORAL | Status: DC

## 2010-08-21 NOTE — Telephone Encounter (Signed)
R'cd fax from [facility] for refills of Actos and Clarinex  Last OV-03/29/2010

## 2010-09-25 IMAGING — CR DG CHEST 2V
2 series · 2 of 2 positions shown · non-contrast
Comparison: None

Addendum Begins

History should read "The patient was attacked by 3 dogs."
Addendum Ends
CLINICAL DATA: The patient was intact by 3 ounces.  Multiple
puncture wounds right index finger and thumb.  Left lower posterior
chest and midshaft left humerus.
CHEST - 2 VIEW

[w chest pa]
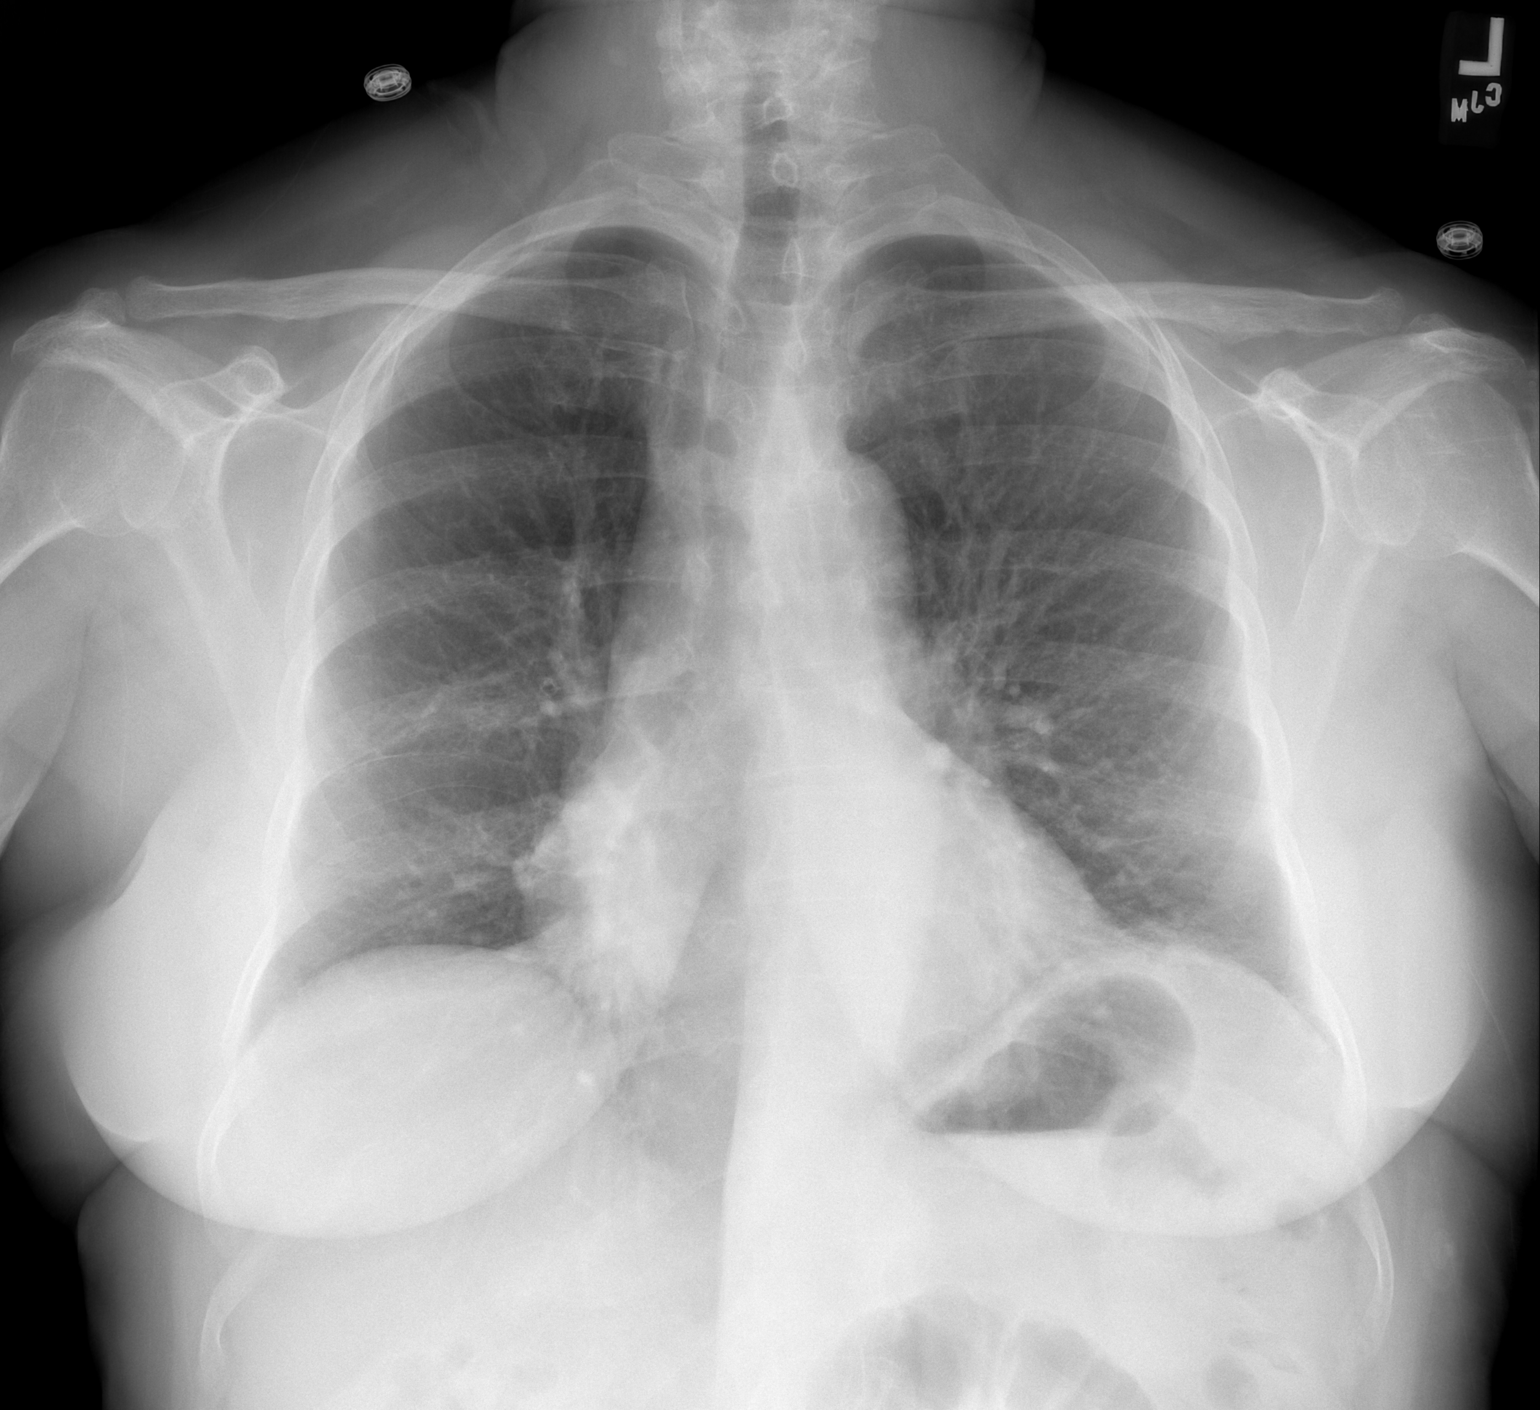

[w chest lat]
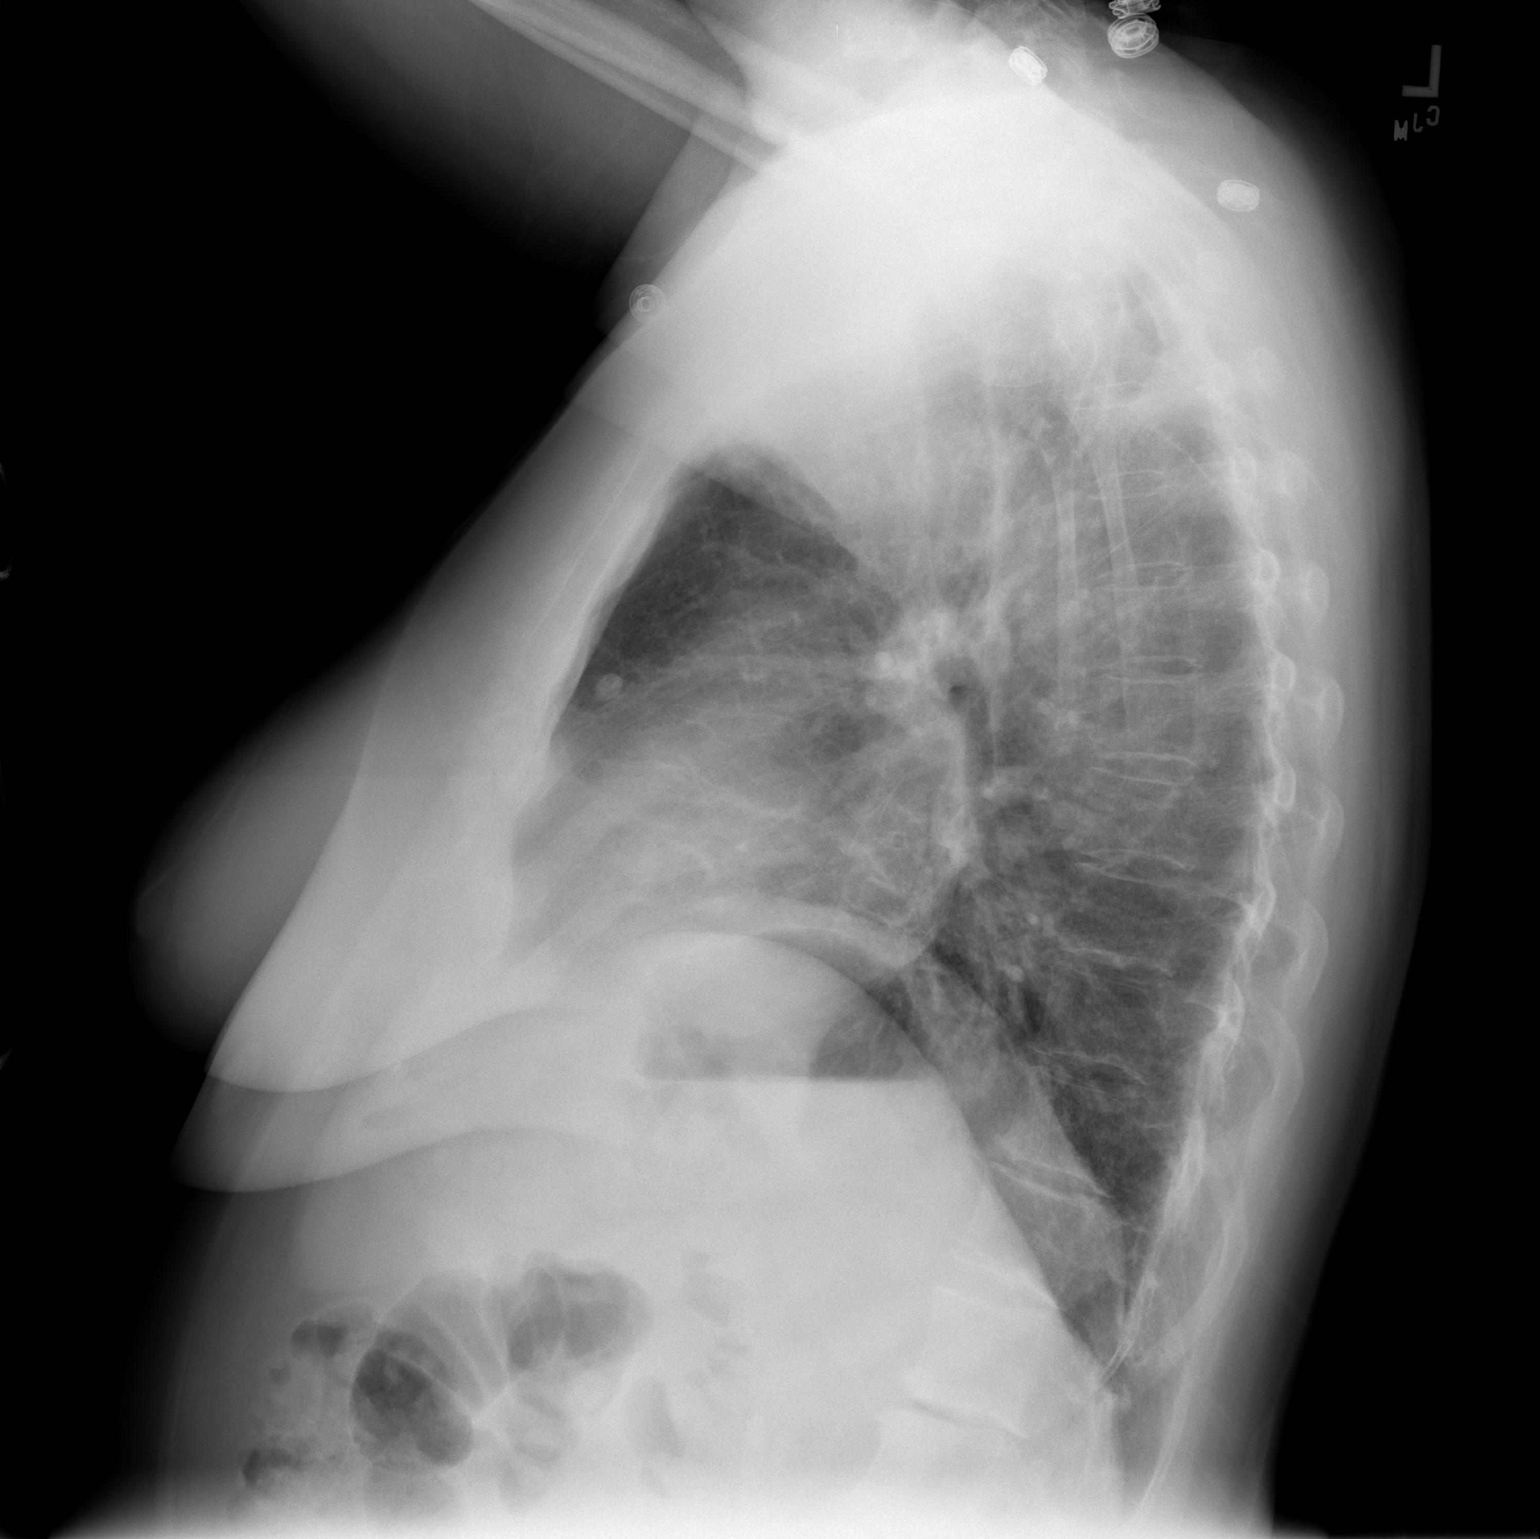

[2 of 2 positions shown; findings below may reference images not displayed]

FINDINGS: Heart size is normal.  No evidence for pneumothorax.
There is right middle lobe streaky density consistent with
atelectasis.  No pleural effusions or evidence for fracture.  Mild
degenerative changes are seen in the spine.
IMPRESSION: 1.  Right middle lobe atelectasis or early infiltrate.
2.  No evidence for pneumothorax or fracture.

## 2010-10-31 ENCOUNTER — Other Ambulatory Visit: Payer: Self-pay | Admitting: Endocrinology

## 2010-10-31 DIAGNOSIS — Z1231 Encounter for screening mammogram for malignant neoplasm of breast: Secondary | ICD-10-CM

## 2010-11-08 ENCOUNTER — Ambulatory Visit
Admission: RE | Admit: 2010-11-08 | Discharge: 2010-11-08 | Disposition: A | Discharge status: Home / Self Care | Payer: BC Managed Care – PPO | Source: Ambulatory Visit | Attending: Endocrinology | Admitting: Endocrinology

## 2010-11-08 DIAGNOSIS — Z1231 Encounter for screening mammogram for malignant neoplasm of breast: Secondary | ICD-10-CM

## 2011-02-01 ENCOUNTER — Encounter: Payer: BC Managed Care – PPO | Admitting: Endocrinology

## 2011-03-07 ENCOUNTER — Encounter: Payer: BC Managed Care – PPO | Admitting: Endocrinology

## 2011-03-07 ENCOUNTER — Other Ambulatory Visit: Payer: Self-pay | Admitting: *Deleted

## 2011-03-07 DIAGNOSIS — E039 Hypothyroidism, unspecified: Secondary | ICD-10-CM

## 2011-03-07 MED ORDER — LEVOTHYROXINE SODIUM 150 MCG PO TABS: ORAL_TABLET | ORAL | Status: DC

## 2011-03-07 NOTE — Telephone Encounter (Signed)
R'cd fax from [facility] for refill of Synthroid.  Last OV-03/2010

## 2011-03-29 ENCOUNTER — Other Ambulatory Visit: Payer: Self-pay | Admitting: *Deleted

## 2011-03-29 DIAGNOSIS — E039 Hypothyroidism, unspecified: Secondary | ICD-10-CM

## 2011-03-29 MED ORDER — LEVOTHYROXINE SODIUM 150 MCG PO TABS: ORAL_TABLET | ORAL | Status: DC

## 2011-03-29 MED ORDER — MONTELUKAST SODIUM 10 MG PO TABS: 10.0000 mg | ORAL_TABLET | Freq: Every day | ORAL | Status: DC

## 2011-03-29 MED ORDER — FLUTICASONE-SALMETEROL 100-50 MCG/DOSE IN AEPB: 1.0000 | INHALATION_SPRAY | Freq: Two times a day (BID) | RESPIRATORY_TRACT | Status: DC

## 2011-03-29 MED ORDER — PIOGLITAZONE HCL 15 MG PO TABS: 15.0000 mg | ORAL_TABLET | Freq: Every day | ORAL | Status: DC

## 2011-03-29 MED ORDER — FLUTICASONE PROPIONATE 50 MCG/ACT NA SUSP: 1.0000 | Freq: Every day | NASAL | Status: DC

## 2011-03-29 MED ORDER — OLMESARTAN MEDOXOMIL 40 MG PO TABS: 40.0000 mg | ORAL_TABLET | Freq: Every day | ORAL | Status: DC

## 2011-03-29 MED ORDER — ALBUTEROL SULFATE HFA 108 (90 BASE) MCG/ACT IN AERS: 2.0000 | INHALATION_SPRAY | Freq: Two times a day (BID) | RESPIRATORY_TRACT | Status: DC

## 2011-03-29 MED ORDER — ATORVASTATIN CALCIUM 20 MG PO TABS: 20.0000 mg | ORAL_TABLET | Freq: Every day | ORAL | Status: DC

## 2011-03-29 NOTE — Telephone Encounter (Signed)
Pt called requesting rx for Levothyroxine be sent to [facility]. Pt states that [facility] has not received prescription. Informed pt that rx for Levothyroxine was sent in January. Pt also wants all rx's sent in to [facility]. Rx sent to pharmacy, pt informed.

## 2011-04-04 IMAGING — CR DG CHEST 2V
2 series · 2 of 2 positions shown · non-contrast
Comparison: 09/05/2009.

CLINICAL DATA: Ex-smoker with cough and shortness of breath.

CHEST - 2 VIEW

[view not recorded (1 of 2)]
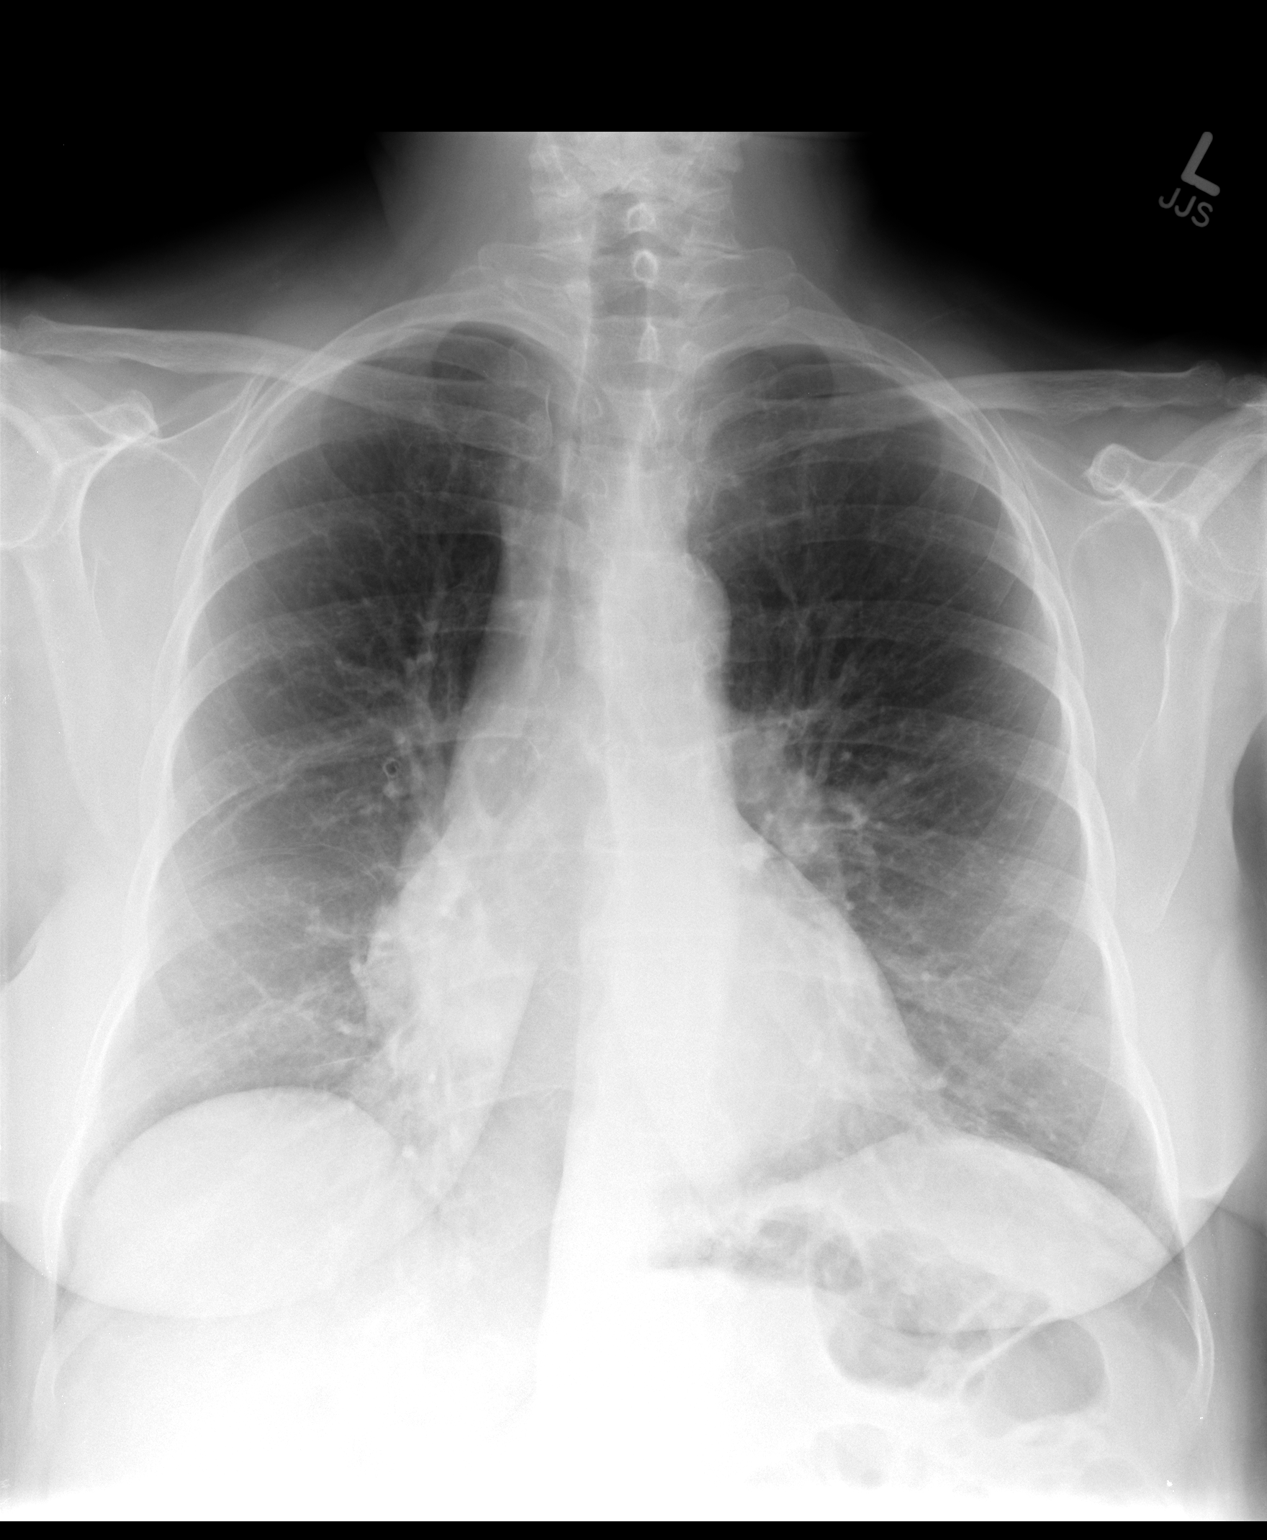

[view not recorded (2 of 2)]
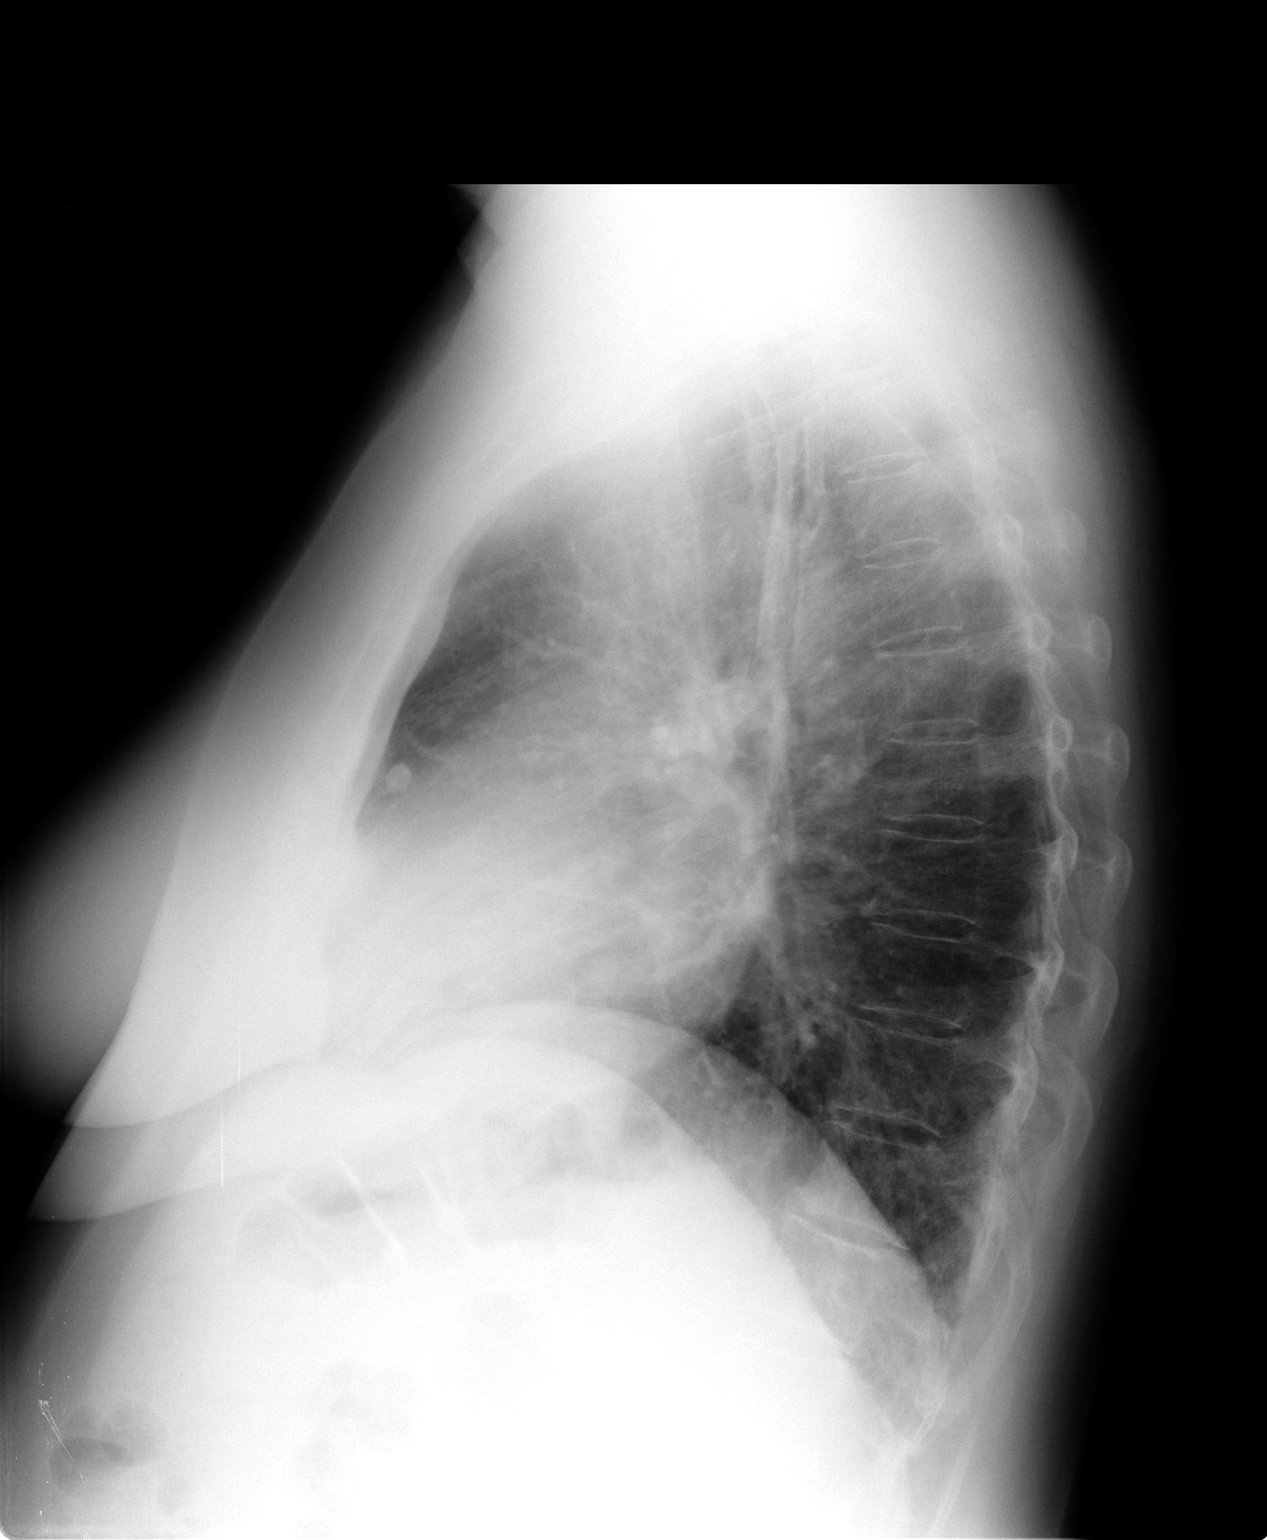

[2 of 2 positions shown; findings below may reference images not displayed]

FINDINGS: The heart size and mediastinal contours are stable.
Chronic streaky basilar densities have improved and are likely due
to scarring.  There is a calcified granuloma in the lingula.  No
confluent airspace opacity, edema or pleural effusion is present.
IMPRESSION: No acute cardiopulmonary process.

## 2011-04-12 ENCOUNTER — Other Ambulatory Visit (INDEPENDENT_AMBULATORY_CARE_PROVIDER_SITE_OTHER): Payer: Medicare Other

## 2011-04-12 ENCOUNTER — Other Ambulatory Visit: Payer: Self-pay | Admitting: *Deleted

## 2011-04-12 DIAGNOSIS — Z0000 Encounter for general adult medical examination without abnormal findings: Secondary | ICD-10-CM

## 2011-04-12 DIAGNOSIS — E039 Hypothyroidism, unspecified: Secondary | ICD-10-CM

## 2011-04-12 DIAGNOSIS — R7309 Other abnormal glucose: Secondary | ICD-10-CM

## 2011-04-12 DIAGNOSIS — E7800 Pure hypercholesterolemia, unspecified: Secondary | ICD-10-CM

## 2011-04-12 LAB — CBC WITH DIFFERENTIAL/PLATELET
Basophils Relative: 0.6 % (ref 0.0–3.0)
Eosinophils Relative: 1.8 % (ref 0.0–5.0)
HCT: 37.2 % (ref 36.0–46.0)
Lymphs Abs: 1.5 10*3/uL (ref 0.7–4.0)
MCV: 95.1 fl (ref 78.0–100.0)
Monocytes Absolute: 0.3 10*3/uL (ref 0.1–1.0)
Monocytes Relative: 6 % (ref 3.0–12.0)
Neutrophils Relative %: 61.5 % (ref 43.0–77.0)
RBC: 3.91 Mil/uL (ref 3.87–5.11)
WBC: 4.9 10*3/uL (ref 4.5–10.5)

## 2011-04-12 LAB — BASIC METABOLIC PANEL
BUN: 16 mg/dL (ref 6–23)
CO2: 28 mEq/L (ref 19–32)
GFR: 91.76 mL/min (ref >60.00)
Glucose, Bld: 104 mg/dL — ABNORMAL HIGH (ref 70–99)
Potassium: 4.3 mEq/L (ref 3.5–5.1)

## 2011-04-12 LAB — URINALYSIS, ROUTINE W REFLEX MICROSCOPIC
Ketones, ur: NEGATIVE
Leukocytes, UA: NEGATIVE
Specific Gravity, Urine: 1.01 (ref 1.000–1.030)
Urine Glucose: NEGATIVE
pH: 6.5 (ref 5.0–8.0)

## 2011-04-12 LAB — LIPID PANEL
Cholesterol: 179 mg/dL (ref 0–200)
Triglycerides: 77 mg/dL (ref 0.0–149.0)

## 2011-04-12 LAB — HEMOGLOBIN A1C: Hgb A1c MFr Bld: 5.8 % (ref 4.6–6.5)

## 2011-04-12 LAB — HEPATIC FUNCTION PANEL
ALT: 21 U/L (ref 0–35)
Total Protein: 7.1 g/dL (ref 6.0–8.3)

## 2011-05-02 ENCOUNTER — Ambulatory Visit (INDEPENDENT_AMBULATORY_CARE_PROVIDER_SITE_OTHER): Payer: BC Managed Care – PPO | Admitting: Endocrinology

## 2011-05-02 ENCOUNTER — Encounter: Payer: Self-pay | Admitting: Endocrinology

## 2011-05-02 VITALS — BP 110/72 | HR 104 | Temp 97.0°F | Ht 61.5 in | Wt 159.0 lb

## 2011-05-02 DIAGNOSIS — I10 Essential (primary) hypertension: Secondary | ICD-10-CM

## 2011-05-02 DIAGNOSIS — E7800 Pure hypercholesterolemia, unspecified: Secondary | ICD-10-CM

## 2011-05-02 MED ORDER — ATORVASTATIN CALCIUM 40 MG PO TABS: 40.0000 mg | ORAL_TABLET | Freq: Every day | ORAL | Status: DC

## 2011-05-02 MED ORDER — OLMESARTAN MEDOXOMIL 20 MG PO TABS: 20.0000 mg | ORAL_TABLET | Freq: Every day | ORAL | Status: DC

## 2011-05-02 NOTE — Patient Instructions (Addendum)
please consider these measures for your health:  minimize alcohol.  do not use tobacco products.  have a colonoscopy at least every 10 years from age 73.  Women should have an annual mammogram from age 68.  keep firearms safely stored.  always use seat belts.  have working smoke alarms in your home.  see an eye doctor and dentist regularly.  never drive under the influence of alcohol or drugs (including prescription drugs).  those with fair skin should take precautions against the sun. please let me know what your wishes would be, if artificial life support measures should become necessary.  it is critically important to prevent falling down (keep floor areas well-lit, dry, and free of loose objects.  If you have a cane, walker, or wheelchair, you should use it, even for short trips around the house.  Also, try not to rush) Please return in 1 year. Increase lipitor to 40 mg daily.   Reduce benicar to 20 mg daily

## 2011-05-02 NOTE — Progress Notes (Signed)
Subjective:    Patient ID: Kelly Hardin, female    DOB: 11-06-1940, 71 y.o.   MRN: [id number]  HPI here for regular wellness examination.  He's feeling pretty well in general, and says chronic med probs are stable, except as noted below.   Past Medical History  Diagnosis Date  . ALLERGIC RHINITIS 09/08/2006  . HYPERTENSION 09/08/2006  . HYPOTHYROIDISM 09/08/2006  . OSTEOPOROSIS 09/08/2006  . ASTHMA NOS W/ACUTE EXACERBATION 03/15/2010  . ANEMIA 03/05/2010  . HYPERGLYCEMIA 02/11/2008    DM  . Dyslipidemia   . Lactose intolerance     Past Surgical History  Procedure Date  . Breast surgery 1976    Breast Bx  . Electrocardiogram 11/13/2005    History   Social History  . Marital Status: Widowed    Spouse Name: N/A    Number of Children: N/A  . Years of Education: N/A   Occupational History  . Pharmacist, hospital    Social History Main Topics  . Smoking status: Not on file  . Smokeless tobacco: Not on file  . Alcohol Use: No  . Drug Use: No  . Sexually Active:    Other Topics Concern  . Not on file   Social History Narrative   Regular exercise-no    Current Outpatient Prescriptions on File Prior to Visit  Medication Sig Dispense Refill  . albuterol (PROAIR HFA) 108 (90 BASE) MCG/ACT inhaler Inhale 2 puffs into the lungs 2 (two) times daily.  3 each  1  . atorvastatin (LIPITOR) 20 MG tablet Take 1 tablet (20 mg total) by mouth at bedtime.  90 tablet  1  . Calcium Carbonate-Vitamin D (CALCIUM-VITAMIN D) 500-200 MG-UNIT per tablet Take 1 tablet by mouth 2 (two) times daily.        Marland Kitchen desloratadine (CLARINEX) 5 MG tablet Take 1 tablet (5 mg total) by mouth daily.  90 tablet  1  . fluticasone (FLONASE) 50 MCG/ACT nasal spray Place 1 spray into the nose daily.  48 g  1  . Fluticasone-Salmeterol (ADVAIR DISKUS) 100-50 MCG/DOSE AEPB Inhale 1 puff into the lungs every 12 (twelve) hours.  180 each  1  . levothyroxine (SYNTHROID, LEVOTHROID) 150 MCG tablet TAKE 1 TABLET DAILY  90 tablet   1  . montelukast (SINGULAIR) 10 MG tablet Take 1 tablet (10 mg total) by mouth daily.  90 tablet  1  . olmesartan (BENICAR) 40 MG tablet Take 1 tablet (40 mg total) by mouth daily.  90 tablet  1  . pioglitazone (ACTOS) 15 MG tablet Take 1 tablet (15 mg total) by mouth daily.  90 tablet  1  . sennosides-docusate sodium (SENOKOT-S) 8.6-50 MG tablet Take 3 tablets by mouth daily.          Allergies  Allergen Reactions  . Alendronate Sodium   . Enalapril Maleate   . Penicillins   . Sulfonamide Derivatives     Family History  Problem Relation Age of Onset  . Cancer Neg Hx     There were no vitals taken for this visit.  Review of Systems  Constitutional: Negative for fever.  HENT: Negative for hearing loss.   Eyes: Negative for visual disturbance.  Respiratory: Negative for shortness of breath.   Cardiovascular: Negative for chest pain.  Gastrointestinal: Negative for anal bleeding.  Genitourinary: Negative for hematuria.  Musculoskeletal: Negative for back pain.  Skin: Negative for rash.  Neurological: Negative for syncope and numbness.  Hematological: Does not bruise/bleed easily.  Psychiatric/Behavioral: Negative for  dysphoric mood.      Objective:   Physical Exam VS: see vs page GEN: no distress HEAD: head: no deformity eyes: no periorbital swelling, no proptosis external nose and ears are normal mouth: no lesion seen NECK: supple, thyroid is not enlarged CHEST WALL: no deformity LUNGS:  Clear to auscultation BREASTS:  No mass.  No d/c CV: reg rate and rhythm, no murmur ABD: abdomen is soft, nontender.  no hepatosplenomegaly.  not distended.  no hernia GENITALIA:  Normal external female.  Normal bimanual exam. RECTAL: normal external and internal exam.  heme neg. MUSCULOSKELETAL: muscle bulk and strength are grossly normal.  no obvious joint swelling.  gait is normal and steady EXTEMITIES: no deformity.  no ulcer on the feet.  feet are of normal color and temp.  no  edema NEURO:  cn 2-12 grossly intact.   readily moves all 4's.  sensation is intact to touch on the feet SKIN:  Normal texture and temperature.  No rash or suspicious lesion is visible.   NODES:  None palpable at the neck PSYCH: alert, oriented x3.  Does not appear anxious nor depressed.     Assessment & Plan:  Wellness visit today, with problems stable, except as noted.   SEPARATE EVALUATION FOLLOWS--EACH PROBLEM HERE IS NEW, NOT RESPONDING TO TREATMENT, OR POSES SIGNIFICANT RISK TO THE PATIENT'S HEALTH: HISTORY OF THE PRESENT ILLNESS: Dyslipidemia: Pt says she takes and tolerates lipitor well HTN: denies dizziness PAST MEDICAL HISTORY reviewed and up to date today REVIEW OF SYSTEMS: Denies weight change PHYSICAL EXAMINATION: VITAL SIGNS:  See vs page GENERAL: no distress Pulses: dorsalis pedis intact bilat.  no carotid bruit LAB/XRAY RESULTS: Lab Results  Component Value Date   WBC 4.9 04/12/2011   HGB 12.5 04/12/2011   HCT 37.2 04/12/2011   PLT 238.0 04/12/2011   GLUCOSE 104* 04/12/2011   CHOL 179 04/12/2011   TRIG 77.0 04/12/2011   HDL 58.30 04/12/2011   LDLDIRECT 192.7 02/07/2009   LDLCALC 105* 04/12/2011   ALT 21 04/12/2011   AST 22 04/12/2011   NA 135 04/12/2011   K 4.3 04/12/2011   CL 100 04/12/2011   CREATININE 0.7 04/12/2011   BUN 16 04/12/2011   CO2 28 04/12/2011   TSH 1.18 04/12/2011   HGBA1C 5.8 04/12/2011  IMPRESSION: Htn, slightly overcontrolled Dyslipidemia, needs increased rx PLAN: See instruction page

## 2011-05-20 ENCOUNTER — Other Ambulatory Visit: Payer: Self-pay | Admitting: *Deleted

## 2011-05-20 MED ORDER — OLMESARTAN MEDOXOMIL 20 MG PO TABS: 20.0000 mg | ORAL_TABLET | Freq: Every day | ORAL | Status: AC

## 2011-05-20 NOTE — Telephone Encounter (Signed)
R'cd fax from [facility] pharmacy for refill of Benicar-rx sent at 05/02/2011 OV but will send again.

## 2011-06-24 ENCOUNTER — Other Ambulatory Visit: Payer: Self-pay

## 2011-06-24 DIAGNOSIS — E039 Hypothyroidism, unspecified: Secondary | ICD-10-CM

## 2011-06-24 MED ORDER — LEVOTHYROXINE SODIUM 150 MCG PO TABS: ORAL_TABLET | ORAL | Status: AC

## 2011-06-24 MED ORDER — PIOGLITAZONE HCL 15 MG PO TABS: 15.0000 mg | ORAL_TABLET | Freq: Every day | ORAL | Status: DC

## 2011-10-14 ENCOUNTER — Telehealth: Payer: Self-pay | Admitting: Endocrinology

## 2011-10-14 NOTE — Telephone Encounter (Signed)
Received 3 pages from [facility]. Sent to Dr. Everardo All. 10/14/11/sd

## 2011-10-15 ENCOUNTER — Encounter: Payer: Self-pay | Admitting: Endocrinology

## 2011-10-15 ENCOUNTER — Ambulatory Visit (INDEPENDENT_AMBULATORY_CARE_PROVIDER_SITE_OTHER): Payer: BC Managed Care – PPO | Admitting: Endocrinology

## 2011-10-15 VITALS — BP 144/84 | HR 89 | Temp 97.4°F | Ht 62.0 in | Wt 151.0 lb

## 2011-10-15 DIAGNOSIS — L259 Unspecified contact dermatitis, unspecified cause: Secondary | ICD-10-CM | POA: Insufficient documentation

## 2011-10-15 MED ORDER — HALOBETASOL PROPIONATE 0.05 % EX CREA: TOPICAL_CREAM | Freq: Three times a day (TID) | CUTANEOUS | Status: AC

## 2011-10-15 MED ORDER — METHYLPREDNISOLONE SODIUM SUCC 125 MG IJ SOLR
125.0000 mg | Freq: Once | INTRAMUSCULAR | Status: AC
  Administered 2011-10-15: 125 mg via INTRAMUSCULAR

## 2011-10-15 NOTE — Patient Instructions (Addendum)
i have sent a prescription to your pharmacy, for a stronger steroid cream. I hope you feel better soon.  If you don't feel better in a few days, please call back.

## 2011-10-15 NOTE — Progress Notes (Signed)
  Subjective:    Patient ID: Kelly Hardin, female    DOB: 06/17/1940, 71 y.o.   MRN: [id number]  HPI Pt states few days of moderate rash on the forearms, and assoc itching.  She had slight improvement with TAC cream.  Past Medical History  Diagnosis Date  . ALLERGIC RHINITIS 09/08/2006  . HYPERTENSION 09/08/2006  . HYPOTHYROIDISM 09/08/2006  . OSTEOPOROSIS 09/08/2006  . ASTHMA NOS W/ACUTE EXACERBATION 03/15/2010  . ANEMIA 03/05/2010  . HYPERGLYCEMIA 02/11/2008    DM  . Dyslipidemia   . Lactose intolerance     Past Surgical History  Procedure Date  . Breast surgery 1976    Breast Bx  . Electrocardiogram 11/13/2005    History   Social History  . Marital Status: Widowed    Spouse Name: N/A    Number of Children: N/A  . Years of Education: N/A   Occupational History  . Pharmacist, hospital    Social History Main Topics  . Smoking status: Never Smoker   . Smokeless tobacco: Not on file  . Alcohol Use: No  . Drug Use: No  . Sexually Active: Not on file   Other Topics Concern  . Not on file   Social History Narrative   Regular exercise-no    Current Outpatient Prescriptions on File Prior to Visit  Medication Sig Dispense Refill  . albuterol (PROAIR HFA) 108 (90 BASE) MCG/ACT inhaler Inhale 2 puffs into the lungs 2 (two) times daily.  3 each  1  . atorvastatin (LIPITOR) 40 MG tablet Take 1 tablet (40 mg total) by mouth daily.  90 tablet  3  . Calcium Carbonate-Vitamin D (CALCIUM-VITAMIN D) 500-200 MG-UNIT per tablet Take 1 tablet by mouth 2 (two) times daily.        Marland Kitchen desloratadine (CLARINEX) 5 MG tablet Take 1 tablet (5 mg total) by mouth daily.  90 tablet  1  . fluticasone (FLONASE) 50 MCG/ACT nasal spray Place 1 spray into the nose daily.  48 g  1  . Fluticasone-Salmeterol (ADVAIR DISKUS) 100-50 MCG/DOSE AEPB Inhale 1 puff into the lungs every 12 (twelve) hours.  180 each  1  . levothyroxine (SYNTHROID, LEVOTHROID) 150 MCG tablet TAKE 1 TABLET DAILY  90 tablet  1  .  montelukast (SINGULAIR) 10 MG tablet Take 1 tablet (10 mg total) by mouth daily.  90 tablet  1  . olmesartan (BENICAR) 20 MG tablet Take 1 tablet (20 mg total) by mouth daily.  90 tablet  3  . pioglitazone (ACTOS) 15 MG tablet Take 1 tablet (15 mg total) by mouth daily.  90 tablet  1  . sennosides-docusate sodium (SENOKOT-S) 8.6-50 MG tablet Take 3 tablets by mouth daily.          Allergies  Allergen Reactions  . Alendronate Sodium   . Enalapril Maleate   . Penicillins   . Sulfonamide Derivatives     Family History  Problem Relation Age of Onset  . Cancer Neg Hx     BP 144/84  Pulse 89  Temp 97.4 F (36.3 C) (Oral)  Ht 5\' 2"  (1.575 m)  Wt 151 lb (68.493 kg)  BMI 27.62 kg/m2  SpO2 98%  Review of Systems denies fever    Objective:   Physical Exam VITAL SIGNS:  See vs page GENERAL: no distress Skin: on the forearms, there is moderate contact dermatitis.       Assessment & Plan:  Contact dermatitis, new Solumedrol 125 mg IM

## 2011-10-25 ENCOUNTER — Other Ambulatory Visit: Payer: Self-pay | Admitting: Endocrinology

## 2011-10-25 DIAGNOSIS — Z1231 Encounter for screening mammogram for malignant neoplasm of breast: Secondary | ICD-10-CM

## 2011-11-04 ENCOUNTER — Other Ambulatory Visit: Payer: Self-pay | Admitting: General Practice

## 2011-11-04 MED ORDER — ATORVASTATIN CALCIUM 40 MG PO TABS: 40.0000 mg | ORAL_TABLET | Freq: Every day | ORAL | Status: DC

## 2011-11-07 ENCOUNTER — Other Ambulatory Visit: Payer: Self-pay | Admitting: General Practice

## 2011-11-07 MED ORDER — ATORVASTATIN CALCIUM 40 MG PO TABS: 40.0000 mg | ORAL_TABLET | Freq: Every day | ORAL | Status: AC

## 2011-11-11 ENCOUNTER — Ambulatory Visit: Payer: BC Managed Care – PPO

## 2011-11-20 ENCOUNTER — Telehealth: Payer: Self-pay | Admitting: Endocrinology

## 2011-11-20 NOTE — Telephone Encounter (Signed)
Patient would like to switch providers because she would rather come to the [facility] building, would you be willing to accept this patient?

## 2011-11-25 NOTE — Telephone Encounter (Signed)
Fine with me

## 2011-11-25 NOTE — Telephone Encounter (Signed)
Pt notified that Dr. Felicity Coyer will continue care.

## 2011-11-25 NOTE — Telephone Encounter (Signed)
ok 

## 2011-12-04 ENCOUNTER — Ambulatory Visit
Admission: RE | Admit: 2011-12-04 | Discharge: 2011-12-04 | Disposition: A | Discharge status: Home / Self Care | Payer: BC Managed Care – PPO | Source: Ambulatory Visit | Attending: Endocrinology | Admitting: Endocrinology

## 2011-12-04 ENCOUNTER — Other Ambulatory Visit: Payer: Self-pay | Admitting: Family Medicine

## 2011-12-04 ENCOUNTER — Ambulatory Visit: Payer: BC Managed Care – PPO

## 2011-12-04 DIAGNOSIS — Z1231 Encounter for screening mammogram for malignant neoplasm of breast: Secondary | ICD-10-CM

## 2012-05-04 ENCOUNTER — Encounter: Payer: BC Managed Care – PPO | Admitting: Endocrinology

## 2012-11-04 ENCOUNTER — Other Ambulatory Visit: Payer: Self-pay

## 2012-11-04 DIAGNOSIS — Z1231 Encounter for screening mammogram for malignant neoplasm of breast: Secondary | ICD-10-CM

## 2012-12-04 ENCOUNTER — Ambulatory Visit
Admission: RE | Admit: 2012-12-04 | Discharge: 2012-12-04 | Disposition: A | Discharge status: Home / Self Care | Payer: Medicare Other | Source: Ambulatory Visit

## 2012-12-04 DIAGNOSIS — Z1231 Encounter for screening mammogram for malignant neoplasm of breast: Secondary | ICD-10-CM

## 2013-04-13 ENCOUNTER — Encounter (INDEPENDENT_AMBULATORY_CARE_PROVIDER_SITE_OTHER): Payer: Self-pay

## 2013-04-13 ENCOUNTER — Ambulatory Visit (INDEPENDENT_AMBULATORY_CARE_PROVIDER_SITE_OTHER): Payer: Medicare Other | Admitting: Diagnostic Neuroimaging

## 2013-04-13 ENCOUNTER — Encounter: Payer: Self-pay | Admitting: Diagnostic Neuroimaging

## 2013-04-13 VITALS — BP 143/75 | HR 71 | Ht 62.0 in | Wt 146.0 lb

## 2013-04-13 DIAGNOSIS — F411 Generalized anxiety disorder: Secondary | ICD-10-CM

## 2013-04-13 DIAGNOSIS — F329 Major depressive disorder, single episode, unspecified: Secondary | ICD-10-CM

## 2013-04-13 DIAGNOSIS — F0390 Unspecified dementia without behavioral disturbance: Secondary | ICD-10-CM

## 2013-04-13 DIAGNOSIS — F3289 Other specified depressive episodes: Secondary | ICD-10-CM

## 2013-04-13 DIAGNOSIS — F03A0 Unspecified dementia, mild, without behavioral disturbance, psychotic disturbance, mood disturbance, and anxiety: Secondary | ICD-10-CM | POA: Insufficient documentation

## 2013-04-13 DIAGNOSIS — R269 Unspecified abnormalities of gait and mobility: Secondary | ICD-10-CM

## 2013-04-13 DIAGNOSIS — F32A Depression, unspecified: Secondary | ICD-10-CM

## 2013-04-13 NOTE — Progress Notes (Signed)
[facility]  PATIENT: Kelly Hardin Back DOB: [birth year]  REFERRING CLINICIAN: Zachery DauerBarnes HISTORY FROM: patient and daughter REASON FOR VISIT: new consult   HISTORICAL  CHIEF COMPLAINT:  Chief Complaint  Patient presents with  . Memory Loss    NP.#6    HISTORY OF PRESENT ILLNESS:   73 year old right-handed female with hypertension, hypercholesteremia, hypothyroidism, depression, anxiety, here for evaluation of memory problems.  Patient reports memory difficulties for past one year. Patient's daughter has noted short-term memory problems since fall 2000 fall. Patient had progressive memory decline in 2013. In January 2014 she had severe respiratory infection and significant cognitive decline. In March 2014 she was advised to take early retirement with severance headache, presumably because she was not perform well at work.  Since that time patient has progressively been more withdrawn. She does not drive that much anymore. When she does drive she feels nervous and tends to get lost easily. Patient's friend and daughter are both concerned with her ability to drive safely. Patient is able to dress herself, bathe herself, para meals at home and take care of household chores. However she does not take care of her finances anymore. Last year patient's daughter took over health care power of attorney and power of attorney as well as finances. Patient had an event where she pays her home mortgage 3 times in the same month.  Patient also has long history of anxiety and depression, never officially diagnosed or treated. These have worsened in the last one year.  REVIEW OF SYSTEMS: Full 14 system review of systems performed and notable only for memory loss confusion dizziness depression anxiety decreased energy racing thoughts allergies runny nose cough wheezing weight loss.  ALLERGIES: Allergies  Allergen Reactions  . Alendronate Sodium   . Asa [Aspirin]   . Enalapril Maleate     . Penicillins   . Sulfonamide Derivatives     HOME MEDICATIONS: Outpatient Prescriptions Prior to Visit  Medication Sig Dispense Refill  . levothyroxine (SYNTHROID, LEVOTHROID) 150 MCG tablet TAKE 1 TABLET DAILY  90 tablet  1  . albuterol (PROAIR HFA) 108 (90 BASE) MCG/ACT inhaler Inhale 2 puffs into the lungs 2 (two) times daily.  3 each  1  . Calcium Carbonate-Vitamin D (CALCIUM-VITAMIN D) 500-200 MG-UNIT per tablet Take 1 tablet by mouth 2 (two) times daily.        Marland Kitchen. desloratadine (CLARINEX) 5 MG tablet Take 1 tablet (5 mg total) by mouth daily.  90 tablet  1  . fluticasone (FLONASE) 50 MCG/ACT nasal spray Place 1 spray into the nose daily.  48 g  1  . Fluticasone-Salmeterol (ADVAIR DISKUS) 100-50 MCG/DOSE AEPB Inhale 1 puff into the lungs every 12 (twelve) hours.  180 each  1  . montelukast (SINGULAIR) 10 MG tablet Take 1 tablet (10 mg total) by mouth daily.  90 tablet  1  . pioglitazone (ACTOS) 15 MG tablet Take 1 tablet (15 mg total) by mouth daily.  90 tablet  1  . sennosides-docusate sodium (SENOKOT-S) 8.6-50 MG tablet Take 3 tablets by mouth daily.         No facility-administered medications prior to visit.    PAST MEDICAL HISTORY: Past Medical History  Diagnosis Date  . ALLERGIC RHINITIS 09/08/2006  . HYPERTENSION 09/08/2006  . HYPOTHYROIDISM 09/08/2006  . OSTEOPOROSIS 09/08/2006  . ASTHMA NOS W/ACUTE EXACERBATION 03/15/2010  . ANEMIA 03/05/2010  . HYPERGLYCEMIA 02/11/2008    DM  . Dyslipidemia   . Lactose intolerance   .  Memory loss     PAST SURGICAL HISTORY: Past Surgical History  Procedure Laterality Date  . Breast surgery  1976    Breast Bx  . Electrocardiogram  11/13/2005  . Eye surgery  1995    FAMILY HISTORY: Family History  Problem Relation Age of Onset  . Cancer Neg Hx   . Heart Problems Mother   . Heart Problems Father     SOCIAL HISTORY:  History   Social History  . Marital Status: Widowed    Spouse Name: N/A    Number of Children: 1  . Years of  Education: N/A   Occupational History  . Pharmacist, hospital   .     Social History Main Topics  . Smoking status: Former Games developer  . Smokeless tobacco: Not on file  . Alcohol Use: Yes  . Drug Use: No  . Sexual Activity: Not on file   Other Topics Concern  . Not on file   Social History Narrative   Regular exercise-no     PHYSICAL EXAM  Filed Vitals:   04/13/13 1108  BP: 143/75  Pulse: 71  Height: 5\' 2"  (1.575 m)  Weight: 146 lb (66.225 kg)    Not recorded    Body mass index is 26.7 kg/(m^2).  GENERAL EXAM: Patient is in no distress; well developed, nourished and groomed; neck is supple; TEARFUL, PRESSURED SPEECH, TANGENTIAL.  CARDIOVASCULAR: Regular rate and rhythm, no murmurs, no carotid bruits  NEUROLOGIC: MENTAL STATUS: awake, alert, oriented to person; DOESN'T KNOW YEAR, DATE, DAY, DATE.ORIENTED TO [place], [place], [place]. 0/3 RECALL. MISSES PENTAGONS. ANIMAL FLUENCY TEST 4. MMSE 19/30.  POSITIVE SNOUT AND MYERSONS REFLEXES. Normal attention and concentration, language fluent, comprehension intact, naming intact, fund of knowledge appropriate. CRANIAL NERVE: no papilledema on fundoscopic exam, pupils equal and reactive to light, visual fields full to confrontation, extraocular muscles intact, no nystagmus, facial sensation and strength symmetric, hearing intact, palate elevates symmetrically, uvula midline, shoulder shrug symmetric, tongue midline. MOTOR: normal bulk and tone, full strength in the BUE, BLE SENSORY: normal and symmetric to light touch, pinprick, temperature, vibration COORDINATION: finger-nose-finger, fine finger movements normal REFLEXES: deep tendon reflexes present and symmetric GAIT/STATION: narrow based gait; SLOW TO RISE, SLOW, CAUTIOUS GAIT. SLOW TURNING. TOE, HEEL, TANDEM UNSTEADY. Romberg is negative    DIAGNOSTIC DATA (LABS, IMAGING, TESTING) - I reviewed patient records, labs, notes, testing and imaging myself where  available.  Lab Results  Component Value Date   WBC 4.9 04/12/2011   HGB 12.5 04/12/2011   HCT 37.2 04/12/2011   MCV 95.1 04/12/2011   PLT 238.0 04/12/2011      Component Value Date/Time   NA 135 04/12/2011 0813   K 4.3 04/12/2011 0813   CL 100 04/12/2011 0813   CO2 28 04/12/2011 0813   GLUCOSE 104* 04/12/2011 0813   BUN 16 04/12/2011 0813   CREATININE 0.7 04/12/2011 0813   CALCIUM 9.4 04/12/2011 0813   PROT 7.1 04/12/2011 0813   ALBUMIN 4.2 04/12/2011 0813   AST 22 04/12/2011 0813   ALT 21 04/12/2011 0813   ALKPHOS 77 04/12/2011 0813   BILITOT 0.4 04/12/2011 0813   GFRNONAA 87.77 02/07/2009 1108   GFRAA 107 02/03/2008 0750   Lab Results  Component Value Date   CHOL 179 04/12/2011   HDL 58.30 04/12/2011   LDLCALC 105* 04/12/2011   LDLDIRECT 192.7 02/07/2009   TRIG 77.0 04/12/2011   CHOLHDL 3 04/12/2011   Lab Results  Component Value Date   HGBA1C 5.8  04/12/2011   Lab Results  Component Value Date   VITAMINB12 345 03/05/2010   Lab Results  Component Value Date   TSH 1.18 04/12/2011      ASSESSMENT AND PLAN  73 y.o. year old female here with progressive short-term memory loss over the past 2-3 years. Having difficulty with paying bills, driving, living alone. I reviewed symptoms, diagnostic testing, treatment options and long-term planning.   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  PLAN: - I would recommend that the patient and family discuss long-term living situation. She would likely need to live with someone else, have someone live with her or transition to assisted living facility. I do not think it is safe for her to live alone anymore. I do not think she should be driving anymore. Once her living situation is stabilized, then we may consider starting her on donepezil and Namenda, possibly with SSRI for her mood stabilization. I will order home health agency referral to help patient and family through this transition. Also will check MRI to look for alternate  causes of memory loss.  Orders Placed This Encounter  Procedures  . MR Brain Wo Contrast  . Home Health  . Face-to-face encounter (required for Medicare/Medicaid patients)   Return in about 3 months (around 07/14/2013).    Suanne Marker, MD 04/13/2013, 12:10 PM Certified in Neurology, Neurophysiology and Neuroimaging  [facility] [address], [address] [place], [place] [address] [phone]

## 2013-04-13 NOTE — Patient Instructions (Signed)
I will order MRI brain and home health services.  Please stop driving.  Please discuss living situation and long term plan with your family. It would be better not to live alone anymore.

## 2013-06-14 ENCOUNTER — Ambulatory Visit: Payer: Medicare Other | Admitting: Diagnostic Neuroimaging

## 2013-11-08 ENCOUNTER — Other Ambulatory Visit: Payer: Self-pay | Admitting: Family Medicine

## 2013-11-08 DIAGNOSIS — N6459 Other signs and symptoms in breast: Secondary | ICD-10-CM

## 2013-11-08 DIAGNOSIS — Z13820 Encounter for screening for osteoporosis: Secondary | ICD-10-CM

## 2016-12-03 ENCOUNTER — Telehealth: Payer: Self-pay | Admitting: *Deleted

## 2016-12-03 NOTE — Telephone Encounter (Signed)
Received PT/INR results from [facility]; forwarded to provider/SLS 10/30
